# Patient Record
Sex: Male | Born: 2000 | Race: White | Hispanic: No | Marital: Single
Health system: Southern US, Community
[De-identification: ages and names within clinical notes are randomized; demographics above are authoritative.]

## PROBLEM LIST (undated history)

## (undated) DIAGNOSIS — K50011 Crohn's disease of small intestine with rectal bleeding: Secondary | ICD-10-CM

## (undated) DIAGNOSIS — G919 Hydrocephalus, unspecified: Secondary | ICD-10-CM

## (undated) DIAGNOSIS — F909 Attention-deficit hyperactivity disorder, unspecified type: Secondary | ICD-10-CM

## (undated) DIAGNOSIS — G060 Intracranial abscess and granuloma: Secondary | ICD-10-CM

## (undated) HISTORY — PX: BRAIN SURGERY: SHX531

## (undated) HISTORY — DX: Attention-deficit hyperactivity disorder, unspecified type: F90.9

---
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
## 2022-01-23 ENCOUNTER — Other Ambulatory Visit: Payer: Self-pay

## 2022-01-23 ENCOUNTER — Emergency Department
Admission: EM | Admit: 2022-01-23 | Discharge: 2022-01-23 | Disposition: A | Discharge status: Home / Self Care | Payer: Medicaid Other | Attending: Student in an Organized Health Care Education/Training Program | Admitting: Student in an Organized Health Care Education/Training Program

## 2022-01-23 ENCOUNTER — Encounter: Payer: Self-pay | Admitting: Emergency Medicine

## 2022-01-23 DIAGNOSIS — L237 Allergic contact dermatitis due to plants, except food: Secondary | ICD-10-CM | POA: Insufficient documentation

## 2022-01-23 DIAGNOSIS — R21 Rash and other nonspecific skin eruption: Secondary | ICD-10-CM | POA: Diagnosis present

## 2022-01-23 MED ORDER — TRIAMCINOLONE ACETONIDE 0.1 % EX CREA: 1.0000 | TOPICAL_CREAM | Freq: Four times a day (QID) | CUTANEOUS | 1 refills | Status: DC

## 2022-01-23 MED ORDER — TRIAMCINOLONE ACETONIDE 40 MG/ML IJ SUSP
40.0000 mg | Freq: Once | INTRAMUSCULAR | Status: AC
  Administered 2022-01-23: 40 mg via INTRAMUSCULAR
  Filled 2022-01-23: qty 1

## 2022-01-23 NOTE — ED Triage Notes (Signed)
Pt via POV from home. Pt c/o rash that started on his L forearm, it started 1 week ago put note noticed it on his stomach and R forearm. Pt states it is intermittently painful but itches. Denies any bug bites. Denies any cough/congestion or fevers. Denies any new detergent, soaps or lotions. Pt has a redness to the R forearm. Pt tried OTC medication with no relief.

## 2022-01-23 NOTE — ED Provider Notes (Signed)
[facility] Provider Note  Patient Contact: 6:25 PM (approximate)   History   Rash   HPI  Roy Burch is a 21 y.o. male who presents the emergency department complaining of a rash to bilateral arms, right abdominal wall.  Patient reports that it is very pruritic.  He denies any new medications, foods, topicals, lotions, detergents, soaps or shampoos.  Patient states that rash first developed on the left forearm, then went to the left upper arm, then right arm and then abdominal wall.  He is tried over-the-counter medications with no relief.  Symptoms have been going on for the last 6 to 7 days.     Physical Exam   Triage Vital Signs: ED Triage Vitals  Enc Vitals Group     BP 01/23/22 1700 123/83     Pulse Rate 01/23/22 1700 91     Resp 01/23/22 1700 18     Temp 01/23/22 1700 98.4 F (36.9 C)     Temp Source 01/23/22 1700 Oral     SpO2 01/23/22 1700 98 %     Weight 01/23/22 1659 120 lb (54.4 kg)     Height 01/23/22 1659 5\' 10"  (1.778 m)     Head Circumference --      Peak Flow --      Pain Score 01/23/22 1659 0     Pain Loc --      Pain Edu? --      Excl. in GC? --     Most recent vital signs: Vitals:   01/23/22 1700  BP: 123/83  Pulse: 91  Resp: 18  Temp: 98.4 F (36.9 C)  SpO2: 98%     General: Alert and in no acute distress.  Cardiovascular:  Good peripheral perfusion Respiratory: Normal respiratory effort without tachypnea or retractions. Lungs CTAB.  Musculoskeletal: Full range of motion to all extremities.  Neurologic:  No gross focal neurologic deficits are appreciated.  Skin:   Patient has a papular rash to both forearms, left upper arm and right abdominal wall.  Findings are consistent with contact dermatitis and from the presentation likely poison ivy/oak/sumac.  Some excoriations from scratching.  No cellulitic changes. Other:   ED Results / Procedures / Treatments   Labs (all labs ordered are listed, but only  abnormal results are displayed) Labs Reviewed - No data to display   EKG     RADIOLOGY    No results found.  PROCEDURES:  Critical Care performed: No  Procedures   MEDICATIONS ORDERED IN ED: Medications  triamcinolone acetonide (KENALOG-40) injection 40 mg (has no administration in time range)     IMPRESSION / MDM / ASSESSMENT AND PLAN / ED COURSE  I reviewed the triage vital signs and the nursing notes.                              Differential diagnosis includes, but is not limited to, contact dermatitis, allergic reaction, poison ivy, poison sumac, poison oak  Patient's presentation is most consistent with acute illness / injury with system symptoms.   Patient's diagnosis is consistent with poison ivy dermatitis.  Patient presented to the emergency department with rash to both arms, right abdominal wall.  Patient's rash is consistent with poison ivy/oak/sumac dermatitis.  Patient will be treated with Kenalog injection.  Topical triamcinolone will be prescribed for the patient at home for symptom relief..  Follow-up primary care as needed.  Patient  is given ED precautions to return to the ED for any worsening or new symptoms.        FINAL CLINICAL IMPRESSION(S) / ED DIAGNOSES   Final diagnoses:  Poison ivy dermatitis     Rx / DC Orders   ED Discharge Orders          Ordered    triamcinolone cream (KENALOG) 0.1 %  4 times daily        01/23/22 1828             Note:  This document was prepared using Dragon voice recognition software and may include unintentional dictation errors.   Lanette Hampshire 01/23/22 Silva Bandy    Willy Eddy, MD 01/23/22 [phone]

## 2024-06-17 DIAGNOSIS — F411 Generalized anxiety disorder: Secondary | ICD-10-CM | POA: Insufficient documentation

## 2024-07-02 ENCOUNTER — Ambulatory Visit

## 2024-07-24 ENCOUNTER — Ambulatory Visit

## 2024-07-24 ENCOUNTER — Observation Stay
Admission: EM | Admit: 2024-07-24 | Discharge: 2024-07-26 | Disposition: A | Attending: Obstetrics and Gynecology | Admitting: Obstetrics and Gynecology

## 2024-07-24 ENCOUNTER — Other Ambulatory Visit: Payer: Self-pay

## 2024-07-24 ENCOUNTER — Encounter: Payer: Self-pay | Admitting: Emergency Medicine

## 2024-07-24 VITALS — BP 102/70 | HR 102 | Ht 69.0 in | Wt 122.8 lb

## 2024-07-24 DIAGNOSIS — K5090 Crohn's disease, unspecified, without complications: Secondary | ICD-10-CM | POA: Insufficient documentation

## 2024-07-24 DIAGNOSIS — F411 Generalized anxiety disorder: Secondary | ICD-10-CM | POA: Diagnosis not present

## 2024-07-24 DIAGNOSIS — K50911 Crohn's disease, unspecified, with rectal bleeding: Secondary | ICD-10-CM

## 2024-07-24 DIAGNOSIS — K625 Hemorrhage of anus and rectum: Secondary | ICD-10-CM

## 2024-07-24 DIAGNOSIS — K922 Gastrointestinal hemorrhage, unspecified: Secondary | ICD-10-CM | POA: Insufficient documentation

## 2024-07-24 DIAGNOSIS — D649 Anemia, unspecified: Secondary | ICD-10-CM | POA: Diagnosis not present

## 2024-07-24 DIAGNOSIS — K5000 Crohn's disease of small intestine without complications: Secondary | ICD-10-CM | POA: Insufficient documentation

## 2024-07-24 DIAGNOSIS — R42 Dizziness and giddiness: Secondary | ICD-10-CM

## 2024-07-24 HISTORY — DX: Crohn's disease of small intestine with rectal bleeding: K50.011

## 2024-07-24 HISTORY — DX: Hydrocephalus, unspecified: G91.9

## 2024-07-24 HISTORY — DX: Intracranial abscess and granuloma: G06.0

## 2024-07-24 LAB — HIV ANTIBODY (ROUTINE TESTING W REFLEX): HIV Screen 4th Generation wRfx: NONREACTIVE

## 2024-07-24 LAB — CBC WITH DIFFERENTIAL/PLATELET
Abs Immature Granulocytes: 0.03 K/uL (ref 0.00–0.07)
Basophils Absolute: 0 K/uL (ref 0.0–0.1)
Basophils Relative: 0 %
Eosinophils Absolute: 0 K/uL (ref 0.0–0.5)
Eosinophils Relative: 0 %
HCT: 22.3 % — ABNORMAL LOW (ref 39.0–52.0)
Hemoglobin: 6.8 g/dL — ABNORMAL LOW (ref 13.0–17.0)
Immature Granulocytes: 0 %
Lymphocytes Relative: 5 %
Lymphs Abs: 0.5 K/uL — ABNORMAL LOW (ref 0.7–4.0)
MCH: 25.2 pg — ABNORMAL LOW (ref 26.0–34.0)
MCHC: 30.5 g/dL (ref 30.0–36.0)
MCV: 82.6 fL (ref 80.0–100.0)
Monocytes Absolute: 0.7 K/uL (ref 0.1–1.0)
Monocytes Relative: 8 %
Neutro Abs: 8.6 K/uL — ABNORMAL HIGH (ref 1.7–7.7)
Neutrophils Relative %: 87 %
Platelets: 242 K/uL (ref 150–400)
RBC: 2.7 MIL/uL — ABNORMAL LOW (ref 4.22–5.81)
RDW: 14.5 % (ref 11.5–15.5)
WBC: 9.9 K/uL (ref 4.0–10.5)
nRBC: 0 % (ref 0.0–0.2)

## 2024-07-24 LAB — COMPREHENSIVE METABOLIC PANEL WITH GFR
ALT: 12 U/L (ref 0–44)
AST: 17 U/L (ref 15–41)
Albumin: 3.5 g/dL (ref 3.5–5.0)
Alkaline Phosphatase: 50 U/L (ref 38–126)
Anion gap: 9 (ref 5–15)
BUN: 18 mg/dL (ref 6–20)
CO2: 26 mmol/L (ref 22–32)
Calcium: 8.1 mg/dL — ABNORMAL LOW (ref 8.9–10.3)
Chloride: 104 mmol/L (ref 98–111)
Creatinine, Ser: 0.83 mg/dL (ref 0.61–1.24)
GFR, Estimated: >60 mL/min (ref >60)
Glucose, Bld: 79 mg/dL (ref 70–99)
Potassium: 3.8 mmol/L (ref 3.5–5.1)
Sodium: 138 mmol/L (ref 135–145)
Total Bilirubin: <0.2 mg/dL (ref 0.0–1.2)
Total Protein: 5.3 g/dL — ABNORMAL LOW (ref 6.5–8.1)

## 2024-07-24 LAB — IRON AND TIBC
Iron: <10 ug/dL — ABNORMAL LOW (ref 45–182)
TIBC: 279 ug/dL (ref 250–450)

## 2024-07-24 LAB — TROPONIN T, HIGH SENSITIVITY
Troponin T High Sensitivity: <15 ng/L (ref 0–19)
Troponin T High Sensitivity: <15 ng/L (ref 0–19)
Troponin T High Sensitivity: <15 ng/L (ref 0–19)

## 2024-07-24 LAB — HEMOGLOBIN AND HEMATOCRIT, BLOOD
HCT: 26.3 % — ABNORMAL LOW (ref 39.0–52.0)
Hemoglobin: 8.4 g/dL — ABNORMAL LOW (ref 13.0–17.0)

## 2024-07-24 LAB — SEDIMENTATION RATE: Sed Rate: 26 mm/h — ABNORMAL HIGH (ref 0–15)

## 2024-07-24 LAB — FERRITIN: Ferritin: 6 ng/mL — ABNORMAL LOW (ref 24–336)

## 2024-07-24 LAB — C-REACTIVE PROTEIN: CRP: 0.7 mg/dL (ref <1.0)

## 2024-07-24 LAB — APTT: aPTT: 31 s (ref 24–36)

## 2024-07-24 LAB — TSH: TSH: 0.861 u[IU]/mL (ref 0.350–4.500)

## 2024-07-24 LAB — MAGNESIUM: Magnesium: 1.8 mg/dL (ref 1.7–2.4)

## 2024-07-24 LAB — PREPARE RBC (CROSSMATCH)

## 2024-07-24 LAB — PROTIME-INR
INR: 1.1 (ref 0.8–1.2)
Prothrombin Time: 15.2 s (ref 11.4–15.2)

## 2024-07-24 MED ORDER — ACETAMINOPHEN 325 MG PO TABS: 650.0000 mg | ORAL_TABLET | Freq: Four times a day (QID) | ORAL | Status: DC | PRN

## 2024-07-24 MED ORDER — ACETAMINOPHEN 650 MG RE SUPP: 650.0000 mg | Freq: Four times a day (QID) | RECTAL | Status: DC | PRN

## 2024-07-24 MED ORDER — PEG 3350-KCL-NA BICARB-NACL 420 G PO SOLR
4000.0000 mL | Freq: Once | ORAL | Status: AC
  Administered 2024-07-24: 4000 mL via ORAL
  Filled 2024-07-24: qty 4000

## 2024-07-24 MED ORDER — SODIUM CHLORIDE 0.9 % IV BOLUS
1000.0000 mL | Freq: Once | INTRAVENOUS | Status: AC
  Administered 2024-07-24: 1000 mL via INTRAVENOUS

## 2024-07-24 MED ORDER — SODIUM CHLORIDE 0.9% FLUSH
3.0000 mL | Freq: Two times a day (BID) | INTRAVENOUS | Status: DC
  Administered 2024-07-24 – 2024-07-26 (×5): 3 mL via INTRAVENOUS

## 2024-07-24 MED ORDER — SODIUM CHLORIDE 0.9 % IV SOLN: INTRAVENOUS | Status: AC

## 2024-07-24 MED ORDER — SODIUM CHLORIDE 0.9 % IV SOLN: Freq: Once | INTRAVENOUS | Status: AC

## 2024-07-24 NOTE — H&P (Addendum)
 History and Physical    Roy Burch FMW:[id number] DOB: 04-25-2001 DOA: 07/24/2024  DOS: the patient was seen and examined on 07/24/2024  PCP: Everlene Parris LABOR, MD   Patient coming from: Clinic  I have personally briefly reviewed patient's old medical records in [facility] Link and CareEverywhere  HPI:   Roy Burch is a 23 y.o. year old male with medical history of Crohn's disease previously on Humira and MDD presenting to the ED for rectal bleeding of subacute nature.   Pt states he has been having some abdominal discomfort and blood in stools since Sunday, 07/20/2024.  Patient states he has these episodes but they do not last this long.  He has history of Crohn's disease and was on Humira but is not on it anymore.  He is not able to give me a reason why he is currently not taking this medication.  He denies any fevers or chills.  He states he was in the process of establishing with a PCP and they referred him here given he became lightheaded when they are drawing his blood.   On arrival to the ED patient was noted to be HDS stable.  Lab work obtained.  CBC without leukocytosis but anemia at 6.8 with baseline around 10.  MCV is normal at 82.  CMP overall unremarkable but with mild decrease in calcium and protein levels.  Given his anemia and symptoms of lightheadedness, [facility] contacted for admission.  Review of Systems: As mentioned in the history of present illness. All other systems reviewed and are negative.   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 He does not have any smokeless tobacco history on file. He reports that he does not drink alcohol. No history on file for drug use. Tobacco-denies use EtOH-drinks occasional alcohol Illicit drug use- denies use.  IADLs/ADLs- can perform independently at baseline    Physical Exam: Vitals:   07/24/24 1115 07/24/24 1203 07/24/24 1341 07/24/24 1405  BP: (!) 112/54 (!) 117/56 (!) 100/55 (!) 97/58  Pulse: (!) 109 (!) 104 (!) 101 82  Resp: 18 14 16 16   Temp:   98 F (36.7 C) 98.5 F (36.9 C)  TempSrc:   Oral Oral  SpO2: 100% 100% 100% 100%  Weight:      Height:       Gen: NAD HENT: Conjunctival pallor CV: Sinus tachycardia, good radial pulse  Resp: CTAB Abd: No TTP, normal bowel sounds MSK: No asymmetry Skin: Pale skin Neuro: Alert and oriented x 4 Psych: Pleasant mood   Labs on Admission: I have personally reviewed following labs and imaging studies  CBC: Recent Labs  Lab 07/24/24 1148  WBC 9.9  NEUTROABS 8.6*  HGB 6.8*  HCT 22.3*  MCV 82.6  PLT 242  Basic Metabolic Panel: Recent Labs  Lab 07/24/24 1148  NA 138  K 3.8  CL 104  CO2 26  GLUCOSE 79  BUN 18  CREATININE 0.83  CALCIUM 8.1*   GFR: Estimated Creatinine Clearance: 108.9 mL/min (by C-G formula based on SCr of 0.83 mg/dL). Liver Function Tests: Recent Labs  Lab 07/24/24 1148  AST 17  ALT 12  ALKPHOS 50  BILITOT <0.2  PROT 5.3*  ALBUMIN 3.5   No results for input(s): LIPASE, AMYLASE in the last 168 hours. No results for input(s): AMMONIA in the last 168 hours. Coagulation Profile: Recent Labs  Lab 07/24/24 1148  INR 1.1   Cardiac Enzymes: No results for input(s): CKTOTAL, CKMB, CKMBINDEX, TROPONINI, TROPONINIHS in the last 168 hours. BNP (last 3 results) No results for input(s): BNP in the last 8760  hours. HbA1C: No results for input(s): HGBA1C in the last 72 hours. CBG: No results for input(s): GLUCAP in the last 168 hours. Lipid Profile: No results for input(s): CHOL, HDL, LDLCALC, TRIG, CHOLHDL, LDLDIRECT in the last 72 hours. Thyroid  Function Tests: No results for input(s): TSH, T4TOTAL, FREET4, T3FREE, THYROIDAB in the last 72 hours. Anemia Panel: No results for input(s): VITAMINB12, FOLATE, FERRITIN, TIBC, IRON, RETICCTPCT in the last 72 hours. Urine analysis:    Component Value Date/Time   COLORURINE YELLOW (A) 12/25/2017 1646   APPEARANCEUR CLOUDY (A) 12/25/2017 1646   APPEARANCEUR Clear 12/20/2012 0048   LABSPEC 1.013 12/25/2017 1646   LABSPEC 1.031 12/20/2012 0048   PHURINE 7.0 12/25/2017 1646   GLUCOSEU NEGATIVE 12/25/2017 1646   GLUCOSEU Negative 12/20/2012 0048   HGBUR NEGATIVE 12/25/2017 1646   BILIRUBINUR NEGATIVE 12/25/2017 1646   BILIRUBINUR Negative 12/20/2012 0048   KETONESUR 5 (A) 12/25/2017 1646   PROTEINUR NEGATIVE 12/25/2017 1646   NITRITE NEGATIVE 12/25/2017 1646   LEUKOCYTESUR NEGATIVE 12/25/2017 1646   LEUKOCYTESUR Negative 12/20/2012 0048    Radiological Exams on Admission: I have personally reviewed images No results found.  EKG: My personal interpretation of EKG shows: Sinus tachycardia without any ischemic changes.  I did show J-point elevation.  Assessment/Plan Principal Problem:   Symptomatic anemia Active Problems:   Exacerbation of Crohn's disease (HCC)   Patient with lightheadedness and blurry vision along with complaint of rectal bleeding found to have hemoglobin of 6.8.  This is likely in the setting of Crohn's flare.  Given his reporting 5 bloody bowel movements we will treat him with steroids.  Will also obtain GI consult to guide further therapy.  It appears he saw [facility] in 2021 but failed to establish with them.  Will get iron studies here.  Will defer further testing to GI team.  If  patient does not improve with current treatment, may need abdominal imaging but will hold off currently.  Stool studies were considered but would likely show inflammation in setting of bloody bowel movements.  We do have baseline ESR and CRP so we will obtain those.  Interval update: given the plan for colonoscopy tomorrow by GI, will hold off on IV steroids for now.   Crohn's disease: Patient was diagnosed in 2020 after colonoscopy showed acute ileitis and small perianal abscess and was discharged on budesonide and then transition to Humira.  He will need to establish with GI.  Appreciate the recommendations while patient is admitted.  Lightheadedness: Patient with episode of lightheadedness which occurred while patient was having blood drawn and patient reports blurry vision and feel like his extremities were getting cold along with  a sensation that he was going to pass out.  His symptoms appear consistent with vasovagal episode in the setting of decreased hemoglobin.  Will obtain orthostatic vitals.  There was some concern from EDP regarding patient's EKG showing ST elevations, but on my interpretation it appears more consistent with J-point elevation.  Troponin are ordered by EDP and we will follow those.  Per EDP he discussed with cardiology and they recommended repeating EKG once patient's hemoglobin improved and if it showed concerning findings then we will consult them.  VTE prophylaxis:  SCDs  Diet: N.p.o. Code Status:  Full Code Telemetry:  Admission status: Observation, Telemetry bed Patient is from: Home Anticipated d/c is to: Home Anticipated d/c is in: 1-2 days   Family Communication: Updated at bedside  Consults called: GI consult.  EDP discussed with Dr. Jinny   Severity of Illness: The appropriate patient status for this patient is OBSERVATION. Observation status is judged to be reasonable and necessary in order to provide the required intensity of service to ensure the  patient's safety. The patient's presenting symptoms, physical exam findings, and initial radiographic and laboratory data in the context of their medical condition is felt to place them at decreased risk for further clinical deterioration. Furthermore, it is anticipated that the patient will be medically stable for discharge from the hospital within 2 midnights of admission.    Morene Bathe, MD Roy Burch. [facility]

## 2024-07-24 NOTE — ED Triage Notes (Signed)
 Pt via POV from home. Pt c/o rectal bleeding since Sunday. Denies hx of anemia. Pt states he went to his PCP this morning and was getting blood work done and when he was getting her blood done he had a near syncopal episode. Denies any pain. States that his bloodwork is being resulted currently. Pt is pale on arrival but VSS. Pt is A&Ox4 and NAD, ambulatory to triage with steady gait.

## 2024-07-24 NOTE — Progress Notes (Addendum)
 New Patient Visit   Physician: Horace Lukas A Nekisha Mcdiarmid, MD  Patient: Roy Burch   DOB: [birth year]   23 y.o. Male  MRN: [id number] Visit Date: 07/24/2024   Chief Complaint  Patient presents with   Establish Care   Subjective  Roy Burch is a 23 y.o. male who presents today as a new patient to establish care.   HPI  Discussed the use of AI scribe software for clinical note transcription with the patient, who gave verbal consent to proceed.  History of Present Illness   Roy Burch is a 23 year old male with Crohn's disease who presents with a flare-up characterized by blood in stool and abdominal pain.  Gastrointestinal symptoms - Diagnosed with Crohn's disease in May 2020 - Flare-up since Sunday night with blood in stool - Bleeding sometimes significant, covering the bottom of the toilet or surrounding the stool - Mild abdominal pain present - Not currently on medication for Crohn's disease as symptoms had been previously without issues  Cardiorespiratory symptoms - suspect anemia - Shortness of breath and lightheadedness, particularly with increased heart rate during physical activity - No chest pain - Heart rate increases quickly with exertion  Psychological symptoms - Situational anxiety when interacting with new people or in new events - Initially introverted but becomes comfortable once familiar with people - History of mild depression diagnosed by a therapist - No current or prior medication for anxiety or depression - Works two physically demanding jobs, totaling 50-55 hours per week, contributing to stress and anxiety  Sleep disturbance - Sleep duration 4-6 hours per night - Acknowledges sleep is insufficient  Lifestyle factors - Occasional alcohol consumption - History of smoking in high school, not regular since then - Diet includes fruits and vegetables - Engages in physical activity through work         ASSESSMENT & PLAN  Encounter  Diagnoses  Name Primary?   Crohn's disease with rectal bleeding, unspecified gastrointestinal tract location (HCC) Yes   Generalized anxiety disorder     Orders Placed This Encounter  Procedures   CALPROTECTIN   CBC with Differential/Platelet   Comprehensive metabolic panel with GFR   C-reactive protein   Iron, TIBC and Ferritin Panel   C-reactive protein   Hemoglobin A1c    Assessment and Plan    Crohn's disease with rectal bleeding - Ordered labs  - CRP, fecal calprotectin, CBC, iron, CMP - Referred to gastroenterology. - Advised to avoid NSAIDs; use Tylenol  for pain. - Instructed to report increased pain, fever, chills, or persistent bleeding.  Iron deficiency anemia (suspected) Suspected due to rectal bleeding and symptoms of lightheadedness and shortness of breath. - Ordered labs for hemoglobin and iron levels. - Monitor and report worsening symptoms.  Situational anxiety Experiences anxiety in new social situations. Discussed medication versus counseling benefits. - Consider counseling. - Discuss situational anxiety medication. - Encouraged self-care and adequate sleep.     He would like to discuss autism as a possibility.  We will discuss at f/u visit in 1 week    ADDENDUM  Patient pre-sycope in lab room.  More blood in stool with BM, moderate volume.  SaO2 98, HR 89 on repeat.  Pale but stable.  Advised to present to ED due to recurrent hematochezia, likely anemia and persistent bleeding. Patient agreed and will have family member transport him.  Needs further eval today for bleeding source.  [facility] called, report to charge nurse given    Objective  BP 102/70   Pulse (!) 102   Ht 5' 9 (1.753 m)   Wt 122 lb 12.8 oz (55.7 kg)   SpO2 (!) 74%   BMI 18.13 kg/m      Review of Systems  Constitutional:  Negative for chills, fever and weight loss.  Eyes:  Negative for blurred vision. h Respiratory:  Negative for cough and shortness of breath.    Cardiovascular:  Negative for chest pain and palpitations.  Skin:  Negative for rash.  Psychiatric/Behavioral:  Negative for depression. The patient is not nervous/anxious.      Physical Exam Physical Exam Vitals reviewed.  Constitutional:      Appearance: Normal appearance. Well-developed with normal weight.  HENT:     Head: Normocephalic and atraumatic.  Normal mucous membranes, no oral lesions Eyes:     Pupils: Pupils are equal, round, and reactive to light.  Neck:     Thyroid : No thyroid  mass or thyromegaly.  Cardiovascular:     Rate and Rhythm: Normal rate and regular rhythm. Normal heart sounds. Normal peripheral pulses Pulmonary:     Normal breath sounds with normal effort Abdominal:   Abdomen is soft, without tenderness or noted hepatosplenomegaly Musculoskeletal:        General: No swelling or edema  Lymphadenopathy:     Cervical: No cervical adenopathy.  Skin:    General: Skin is warm and dry without noticeable rash.  Pallor noted, pale mucous membranes Neurological:     General: No focal deficit present.  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   No family history on file. Social History   Socioeconomic History   Marital status: Single    Spouse name: Not on file   Number of children: Not on file   Years of education: Not on file   Highest education level: Not on file  Occupational History   Not on file  Tobacco Use   Smoking status: Passive Smoke Exposure - Never Smoker   Smokeless tobacco: Not on file  Substance and Sexual Activity   Alcohol use: No   Drug use: Not on file   Sexual activity: Not on file  Other Topics Concern   Not on file  Social History Narrative   Not on  file   Social Drivers of Health   Financial Resource Strain: Not on file  Food Insecurity: Not on file  Transportation Needs: Not on file  Physical Activity: Not on file  Stress: Not on file  Social Connections: Not on file   Outpatient Medications Prior to Visit  Medication Sig   [DISCONTINUED] diphenhydramine-acetaminophen  (TYLENOL  PM) 25-500 MG TABS tablet Take 2 tablets by mouth at bedtime as needed.   [DISCONTINUED] triamcinolone  cream (KENALOG ) 0.1 % Apply 1 application. topically 4 (four) times daily.   No facility-administered medications prior to visit.   Allergies  Allergen Reactions   Poison Ivy Treatments Itching     There is no immunization history on file for this patient.  Health Maintenance  Topic Date Due   HPV VACCINES (1 - Male 3-dose series) Never done   HIV Screening  Never done   Meningococcal B Vaccine (1 of 2 - Standard) Never  done   Hepatitis C Screening  Never done   DTaP/Tdap/Td (1 - Tdap) Never done   Hepatitis B Vaccines 19-59 Average Risk (1 of 3 - 19+ 3-dose series) Never done   Influenza Vaccine  Never done   COVID-19 Vaccine (1 - 2025-26 season) Never done   Pneumococcal Vaccine  Aged Out    Patient Care Team: Pcp, No as PCP - General  Depression Screen     No data to display           Parris DELENA Juneau, MD  [facility] [phone] (phone) [phone] (fax)  [facility]

## 2024-07-24 NOTE — Consult Note (Signed)
 Rogelia Copping, MD [facility]  [address], [address] [place], [place] [address] Phone: [phone] Fax : [phone]  Consultation  Referring Provider:     Dr. Fernand Primary Care Physician:  Everlene Parris LABOR, MD Primary Gastroenterologist:  Dr. Aundria         Reason for Consultation:     Crohn's flare  Date of Admission:  07/24/2024 Date of Consultation:  07/24/2024         HPI:   Roy Burch is a 23 y.o. male who has a history of being diagnosed with Crohn disease after being seen by [facility]'s back in May 2020 with acute onset of hematochezia resulting in anemia.  The patient was transfused and underwent a Meckel scan which was negative and subsequently underwent a exploratory laparotomy at [facility] with no evidence of a Meckel's diverticulum but inflammation was seen involving 20 cm of the terminal ileum filled with blood.  He underwent an EGD and colonoscopy at that time that showed acute inflammation that may be consistent with Crohn's disease.  The patient was started on budesonide  and ended up having a small perianal abscess.  The patient was then started on Humira back in 2020 but states he only took it for couple months and cannot remember if it helped him or not.  Patient received Flagyl for his perianal abscess.  He then followed up with Dr. Aundria at [facility] back in 2021 and subsequent to that did not follow-up again.  He reports that he started having bloody stools on "Sunday with multiple episodes.  He was establishing care with a local primary care doctor and had labs drawn that showed him to be anemic and he was told to go to the emergency department.  The patient's blood work showed the patient to have very low iron at less than 10 with a ferritin of 6 and his CBC showed:  Component     Latest Ref Rng 07/24/2024  Hemoglobin     13.0 - 17.0 g/dL 6.8 (L)   HCT     39" .0 - 52.0 % 22.3 (L)    The patient also reported that he was having some  lightheadedness with blurred vision in addition to his rectal bleeding.  Further testing was deferred to the GI team.  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   Mastoiditis of left side 01/07/2018    Past Surgical History:  Procedure Laterality Date   BRAIN SURGERY      Prior to Admission medications   Not on File    Family History  Problem Relation Age of Onset   Mental illness Father    Thyroid  disease Father    Bipolar disorder Father    Heart disease Father    Cancer Father    Depression Father      Social History   Tobacco Use   Smoking status: Passive Smoke Exposure - Never Smoker  Substance Use Topics   Alcohol use: No    Allergies as of 07/24/2024 - Review Complete 07/24/2024  Allergen Reaction Noted   Poison ivy treatments Itching 07/24/2024    Review of Systems:    All systems reviewed and negative except where noted in HPI.   Physical Exam:  Vital signs in last 24 hours: Temp:  [97.8 F (36.6 C)-98.5 F (36.9 C)] 97.9 F (36.6 C) (12/04 1452) Pulse Rate:  [80-112] 80 (12/04 1452) Resp:  [  14-18] 16 (12/04 1452) BP: (91-117)/(46-71) 91/46 (12/04 1452) SpO2:  [74 %-100 %] 100 % (12/04 1452) Weight:  [55.6 kg-55.7 kg] 55.6 kg (12/04 1051)   General:   Pleasant, cooperative in NAD Head:  Normocephalic and atraumatic. Eyes:   No icterus.   Conjunctiva pink. PERRLA. Ears:  Normal auditory acuity. Neck:  Supple; no masses or thyroidomegaly Lungs: Respirations even and unlabored. Lungs clear to auscultation bilaterally.   No wheezes, crackles, or rhonchi.  Heart:  Regular rate and rhythm;  Without murmur, clicks, rubs or gallops Abdomen:  Soft, nondistended, nontender. Normal bowel sounds. No appreciable masses or hepatomegaly.  No rebound or guarding.  Rectal:  Not performed. Msk:  Symmetrical without gross deformities.   Extremities:  Without edema, cyanosis or clubbing. Neurologic:  Alert and  oriented x3;  grossly normal neurologically. Skin:  Intact without significant lesions or rashes. Cervical Nodes:  No significant cervical adenopathy. Psych:  Alert and cooperative. Normal affect.  LAB RESULTS: Recent Labs    07/24/24 1148  WBC 9.9  HGB 6.8*  HCT 22.3*  PLT 242   BMET Recent Labs    07/24/24 1148  NA 138  K 3.8  CL 104  CO2 26  GLUCOSE 79  BUN 18  CREATININE 0.83  CALCIUM 8.1*   LFT Recent Labs    07/24/24 1148  PROT 5.3*  ALBUMIN 3.5  AST 17  ALT 12  ALKPHOS 50  BILITOT <0.2   PT/INR Recent Labs    07/24/24 1148  LABPROT 15.2  INR 1.1    STUDIES: No results found.    Impression / Plan:   Assessment: Principal Problem:   Symptomatic anemia Active Problems:   Exacerbation of Crohn's disease (HCC)   Lightheadedness   Roy Burch is a 23 y.o. y/o male with a history of active colitis in the terminal ileum with imaging showing terminal ileitis and being diagnosed in the past with Crohn's disease.  The patient was on Humira but has not followed up with anybody since 2021.  The patient does follow-up with Dr. Aundria but has not been on any maintenance therapy.  He now comes in with rectal bleeding.  He also was found to have significant iron deficiency anemia.  Plan:  This patient likely has a flare of chronic disease but still needs stool sample sent off.  The patient has been ordered a GI panel and also C. difficile.  The patient will have a colonoscopy set up for tomorrow to see the extent of disease and obtain biopsies for staging of his Crohn's disease and possible HSV and CMV as well.  The patient has been explained the plan and agrees with taking the prep today for the colonoscopy for tomorrow.  Thank you for involving me in the care of this patient.      LOS: 0 days   Rogelia Copping, MD, MD. NOLIA 07/24/2024, 2:55 PM,  Pager [phone] 7am-5pm  Check AMION for 5pm -7am coverage and on weekends   Note: This dictation was  prepared with Dragon dictation along with smaller phrase technology. Any transcriptional errors that result from this process are unintentional.

## 2024-07-24 NOTE — ED Provider Notes (Signed)
 [facility] Provider Note    Event Date/Time   First MD Initiated Contact with Patient 07/24/24 1102     (approximate)   History   Rectal Bleeding   HPI  Roy Burch is a 23 y.o. male with history of Crohn's disease who comes in with concerns for rectal bleeding.  I reviewed the note from today where patient was seen by primary care doctor.  Patient is history of Crohn's disease diagnosed in May 2020 patient reports having a flareup since Sunday night with blood in the stool sometimes the blood is significant coming the bottom of the toilet or surrounding the stool.  He is not currently on any medications for Crohn's disease.  Does report feeling a little lightheaded.  When patient was having his blood work done he had a near syncopal episode.  To me patient denies any abdominal pain.  To me he mostly reports rectal bleeding.  He reports that this has been intermittent since Sunday.  He reports that this happened previously before he was diagnosed with Crohn's disease and that he did lose enough blood that he required a blood transfusion. He reports that whenever try to get the blood work that he did feel little bit of dizziness but did not fully pass out did not hit his head.  Physical Exam   Triage Vital Signs: ED Triage Vitals  Encounter Vitals Group     BP 07/24/24 1052 117/71     Girls Systolic BP Percentile --      Girls Diastolic BP Percentile --      Boys Systolic BP Percentile --      Boys Diastolic BP Percentile --      Pulse Rate 07/24/24 1052 (!) 112     Resp 07/24/24 1052 18     Temp 07/24/24 1052 97.8 F (36.6 C)     Temp Source 07/24/24 1052 Oral     SpO2 07/24/24 1052 100 %     Weight 07/24/24 1051 122 lb 9.6 oz (55.6 kg)     Height 07/24/24 1051 5' 9 (1.753 m)     Head Circumference --      Peak Flow --      Pain Score 07/24/24 1051 0     Pain Loc --      Pain Education --      Exclude from Growth Chart --     Most  recent vital signs: Vitals:   07/24/24 1052 07/24/24 1114  BP: 117/71   Pulse: (!) 112   Resp: 18   Temp: 97.8 F (36.6 C)   SpO2: 100% 100%     General: Awake, no distress.  CV:  Good peripheral perfusion.  Tachycardic Resp:  Normal effort.  Abd:  No distention.  Soft and nontender Other:  No blood on rectal    ED Results / Procedures / Treatments   Labs (all labs ordered are listed, but only abnormal results are displayed) Labs Reviewed  CBC WITH DIFFERENTIAL/PLATELET - Abnormal; Notable for the following components:      Result Value   RBC 2.70 (*)    Hemoglobin 6.8 (*)    HCT 22.3 (*)    MCH 25.2 (*)    Neutro Abs 8.6 (*)    Lymphs Abs 0.5 (*)    All other components within normal limits  COMPREHENSIVE METABOLIC PANEL WITH GFR  PROTIME-INR  APTT  TYPE AND SCREEN     EKG  My interpretation of EKG:  Normal sinus rhythm 97, with ST elevation in V3 and V2 without any reciprocal changes  RADIOLOGY I have reviewed the xray personally and interpreted    PROCEDURES:  Critical Care performed: Yes, see critical care procedure note(s)  .1-3 Lead EKG Interpretation  Performed by: Ernest Ronal BRAVO, MD Authorized by: Ernest Ronal BRAVO, MD     Interpretation: normal     ECG rate:  80   ECG rate assessment: normal     Rhythm: sinus rhythm     Ectopy: none     Conduction: normal   .Critical Care  Performed by: Ernest Ronal BRAVO, MD Authorized by: Ernest Ronal BRAVO, MD   Critical care provider statement:    Critical care time (minutes):  30   Critical care was necessary to treat or prevent imminent or life-threatening deterioration of the following conditions: symptomatic anemia.   Critical care was time spent personally by me on the following activities:  Development of treatment plan with patient or surrogate, discussions with consultants, evaluation of patient's response to treatment, examination of patient, ordering and review of laboratory studies, ordering and review  of radiographic studies, ordering and performing treatments and interventions, pulse oximetry, re-evaluation of patient's condition and review of old charts    MEDICATIONS ORDERED IN ED: Medications  sodium chloride  flush (NS) 0.9 % injection 3 mL (3 mLs Intravenous Given 07/24/24 1359)  acetaminophen  (TYLENOL ) tablet 650 mg (has no administration in time range)    Or  acetaminophen  (TYLENOL ) suppository 650 mg (has no administration in time range)  sodium chloride  0.9 % bolus 1,000 mL (0 mLs Intravenous Stopped 07/24/24 1227)  0.9 %  sodium chloride  infusion (0 mLs Intravenous Stopped 07/24/24 1400)     IMPRESSION / MDM / ASSESSMENT AND PLAN / ED COURSE  I reviewed the triage vital signs and the nursing notes.   Patient's presentation is most consistent with acute presentation with potential threat to life or bodily function.   Patient comes in with multiple episodes of rectal bleeding no current rectal bleeding noted.  No pain in abdomen.  Patient's workup was done to evaluate for anemia related to rectal bleeding do not feel like CT angio is necessary at this time given no active bleeding.  Hemoglobin is low at 6.8   His EKG does look a little abnormal with some ST elevation but I do suspect this is more likely demand regards to anemia.  He has got no active chest pain.  I will add on troponin to evaluate for type II nstemi in relation to anemia.  At this time given known rectal bleeding heparin would make things much worse as well as aspirin I'm going to hold these.  12:20 PM repeat evaluation abdomen soft and nontender.  Discussed with patient abnormal hemoglobin and need for blood.  Patient was consented for blood.  We discussed admission to the hospital for GI consultation and treatment of anemia.  He expressed understanding and felt comfortable with this plan.  12:37 PM his EKG could also be potentially seen with having like Brugada.  I will have him repeat the EKG. D/w Dr Gollan  agree with medically management and  Second EKG looks less brugada and more likely early repol- recommends getting trop and monitoring.  IF elevated then will need to discuss with stemi doc. And reocmmend another EKG after pt gets fluids.   Trop negative   The patient is on the cardiac monitor to evaluate for evidence of arrhythmia and/or significant heart rate  changes.      FINAL CLINICAL IMPRESSION(S) / ED DIAGNOSES   Final diagnoses:  Symptomatic anemia  Gastrointestinal hemorrhage, unspecified gastrointestinal hemorrhage type     Rx / DC Orders   ED Discharge Orders     None        Note:  This document was prepared using Dragon voice recognition software and may include unintentional dictation errors.   Ernest Ronal BRAVO, MD 07/24/24 [phone]

## 2024-07-24 NOTE — ED Notes (Addendum)
 Pt reports that he has noticed dark red blood in his stool since Sunday night and states that on Monday he noticed more blood then on Sunday. Pt states that he went to [facility] this morning to be checked out and try and establish a PCP. Pt reports that during blood work, he became dizzy and lightheaded and saw colors. Pt reports that since Sunday his heart rate beats really fast with any movement or activity. Pt reports a hx of Crohn's. Pt appears pale. Pt is A&Ox4. Pt NAD at this time.

## 2024-07-24 NOTE — ED Notes (Signed)
 Called CCMD to add pt to monitoring.

## 2024-07-24 NOTE — Plan of Care (Signed)

## 2024-07-25 ENCOUNTER — Observation Stay: Admitting: Anesthesiology

## 2024-07-25 ENCOUNTER — Encounter: Admission: EM | Disposition: A | Payer: Self-pay | Source: Home / Self Care | Attending: Emergency Medicine

## 2024-07-25 ENCOUNTER — Encounter: Payer: Self-pay | Admitting: Internal Medicine

## 2024-07-25 DIAGNOSIS — D649 Anemia, unspecified: Secondary | ICD-10-CM | POA: Diagnosis not present

## 2024-07-25 HISTORY — PX: POLYPECTOMY: SHX149

## 2024-07-25 HISTORY — PX: COLONOSCOPY: SHX5424

## 2024-07-25 LAB — GASTROINTESTINAL PANEL BY PCR, STOOL (REPLACES STOOL CULTURE)

## 2024-07-25 LAB — CBC
HCT: 24.6 % — ABNORMAL LOW (ref 39.0–52.0)
HCT: 19.4 % — ABNORMAL LOW (ref 39.0–52.0)
Hemoglobin: 7.6 g/dL — ABNORMAL LOW (ref 13.0–17.0)
Hemoglobin: 6.2 g/dL — ABNORMAL LOW (ref 13.0–17.0)
MCH: 26 pg (ref 26.0–34.0)
MCH: 25.8 pg — ABNORMAL LOW (ref 26.0–34.0)
MCHC: 30.9 g/dL (ref 30.0–36.0)
MCHC: 32 g/dL (ref 30.0–36.0)
MCV: 84.2 fL (ref 80.0–100.0)
MCV: 80.8 fL (ref 80.0–100.0)
Platelets: 165 K/uL (ref 150–400)
Platelets: 181 K/uL (ref 150–400)
RBC: 2.92 MIL/uL — ABNORMAL LOW (ref 4.22–5.81)
RBC: 2.4 MIL/uL — ABNORMAL LOW (ref 4.22–5.81)
RDW: 15.1 % (ref 11.5–15.5)
RDW: 14.8 % (ref 11.5–15.5)
WBC: 4.2 K/uL (ref 4.0–10.5)
WBC: 3.6 K/uL — ABNORMAL LOW (ref 4.0–10.5)
nRBC: 0 % (ref 0.0–0.2)
nRBC: 0 % (ref 0.0–0.2)

## 2024-07-25 LAB — PREPARE RBC (CROSSMATCH)

## 2024-07-25 LAB — HEMOGLOBIN A1C
Hgb A1c MFr Bld: 5.1 % (ref <5.7)
Mean Plasma Glucose: 100 mg/dL
eAG (mmol/L): 5.5 mmol/L

## 2024-07-25 LAB — COMPREHENSIVE METABOLIC PANEL WITH GFR
AG Ratio: 1.9 (calc) (ref 1.0–2.5)
ALT: 10 U/L (ref 9–46)
AST: 16 U/L (ref 10–40)
Albumin: 3.8 g/dL (ref 3.6–5.1)
Alkaline phosphatase (APISO): 52 U/L (ref 36–130)
BUN: 19 mg/dL (ref 7–25)
CO2: 24 mmol/L (ref 20–32)
Calcium: 8.4 mg/dL — ABNORMAL LOW (ref 8.6–10.3)
Chloride: 106 mmol/L (ref 98–110)
Creat: 0.75 mg/dL (ref 0.60–1.24)
Globulin: 2 g/dL (ref 1.9–3.7)
Glucose, Bld: 88 mg/dL (ref 65–99)
Potassium: 4.4 mmol/L (ref 3.5–5.3)
Sodium: 139 mmol/L (ref 135–146)
Total Bilirubin: 0.3 mg/dL (ref 0.2–1.2)
Total Protein: 5.8 g/dL — ABNORMAL LOW (ref 6.1–8.1)
eGFR: 130 mL/min/1.73m2 (ref >=60)

## 2024-07-25 LAB — C-REACTIVE PROTEIN: CRP: 4.7 mg/L (ref <8.0)

## 2024-07-25 LAB — C DIFFICILE QUICK SCREEN W PCR REFLEX
C Diff antigen: NEGATIVE
C Diff interpretation: NOT DETECTED
C Diff toxin: NEGATIVE

## 2024-07-25 LAB — BASIC METABOLIC PANEL WITH GFR
Anion gap: 9 (ref 5–15)
BUN: 11 mg/dL (ref 6–20)
CO2: 24 mmol/L (ref 22–32)
Calcium: 8.3 mg/dL — ABNORMAL LOW (ref 8.9–10.3)
Chloride: 105 mmol/L (ref 98–111)
Creatinine, Ser: 0.8 mg/dL (ref 0.61–1.24)
GFR, Estimated: >60 mL/min (ref >60)
Glucose, Bld: 77 mg/dL (ref 70–99)
Potassium: 4.7 mmol/L (ref 3.5–5.1)
Sodium: 138 mmol/L (ref 135–145)

## 2024-07-25 LAB — IRON,TIBC AND FERRITIN PANEL
%SAT: 5 % — ABNORMAL LOW (ref 20–48)
Ferritin: 6 ng/mL — ABNORMAL LOW (ref 38–380)
Iron: 15 ug/dL — ABNORMAL LOW (ref 50–195)
TIBC: 315 ug/dL (ref 250–425)

## 2024-07-25 LAB — CBC WITH DIFFERENTIAL/PLATELET

## 2024-07-25 LAB — HEMOGLOBIN AND HEMATOCRIT, BLOOD
HCT: 24.3 % — ABNORMAL LOW (ref 39.0–52.0)
Hemoglobin: 8.1 g/dL — ABNORMAL LOW (ref 13.0–17.0)

## 2024-07-25 SURGERY — COLONOSCOPY
Anesthesia: General

## 2024-07-25 MED ORDER — PROPOFOL 10 MG/ML IV BOLUS
INTRAVENOUS | Status: AC
  Filled 2024-07-25: qty 20

## 2024-07-25 MED ORDER — BUDESONIDE 3 MG PO CPEP
9.0000 mg | ORAL_CAPSULE | Freq: Every day | ORAL | Status: DC
  Administered 2024-07-25: 9 mg via ORAL
  Filled 2024-07-25 (×2): qty 3

## 2024-07-25 MED ORDER — BUDESONIDE 3 MG PO CPEP: 9.0000 mg | ORAL_CAPSULE | Freq: Every day | ORAL | Status: DC

## 2024-07-25 MED ORDER — PHENYLEPHRINE HCL (PRESSORS) 10 MG/ML IV SOLN
INTRAVENOUS | Status: DC | PRN
  Administered 2024-07-25: 80 ug via INTRAVENOUS
  Administered 2024-07-25: 40 ug via INTRAVENOUS

## 2024-07-25 MED ORDER — PROPOFOL 1000 MG/100ML IV EMUL
INTRAVENOUS | Status: AC
  Filled 2024-07-25: qty 100

## 2024-07-25 MED ORDER — DEXMEDETOMIDINE HCL IN NACL 80 MCG/20ML IV SOLN
INTRAVENOUS | Status: DC | PRN
  Administered 2024-07-25 (×2): 8 ug via INTRAVENOUS

## 2024-07-25 MED ORDER — PROPOFOL 500 MG/50ML IV EMUL
INTRAVENOUS | Status: DC | PRN
  Administered 2024-07-25: 140 ug/kg/min via INTRAVENOUS
  Administered 2024-07-25: 100 mg via INTRAVENOUS
  Administered 2024-07-25 (×2): 50 mg via INTRAVENOUS

## 2024-07-25 MED ORDER — SODIUM CHLORIDE 0.9% IV SOLUTION: Freq: Once | INTRAVENOUS | Status: AC

## 2024-07-25 NOTE — Plan of Care (Signed)

## 2024-07-25 NOTE — Plan of Care (Signed)
   Problem: Education: Goal: Knowledge of General Education information will improve Description Including pain rating scale, medication(s)/side effects and non-pharmacologic comfort measures Outcome: Progressing

## 2024-07-25 NOTE — Progress Notes (Signed)
 PROGRESS NOTE    Roy Burch  FMW:[id number] DOB: 05/01/2001 DOA: 07/24/2024 PCP: Everlene Parris LABOR, MD  Outpatient Specialists: none    Brief Narrative:   Hx crohn's presenting with 5 days painless hematochezia   Assessment & Plan:   Principal Problem:   Symptomatic anemia Active Problems:   Exacerbation of Crohn's disease (HCC)   Lightheadedness   # Crohn's with exacerbation First diagnosed 2020 at [facility], briefly on budesonide  and then humira, off all meds and no GI f/u since 2021, has been asymptomatic, but now 5 days painless hematochezia - colonoscopy scheduled for today, gi consulted, appreciate recs, will defer therapy for Crohn's to them  # Acute blood loss anemia 2/2 above hematochezia. No recent hgb for comparison, was 6.8 on arrival, transfused one unit, today 7.6. no significant ongoing hematochezia. Symptomatically improved - trend   DVT prophylaxis: SCDs Code Status: full Family Communication: none at bedside  Level of care: Telemetry Status is: Observation    Consultants:  GI  Procedures: pending  Antimicrobials:  none    Subjective: Reports feeling well. Lightheadedness resolved. No abdominal pain.   Objective: Vitals:   07/24/24 1949 07/25/24 0408 07/25/24 0500 07/25/24 0842  BP: 106/67 110/69  115/70  Pulse: 87 88  88  Resp: 16 16  16   Temp: 98 F (36.7 C) 97.6 F (36.4 C)  (!) 97.5 F (36.4 C)  TempSrc:  Oral    SpO2: 100% 100%  100%  Weight:   58.5 kg   Height:        Intake/Output Summary (Last 24 hours) at 07/25/2024 0924 Last data filed at 07/25/2024 9371 Gross per 24 hour  Intake 3218.26 ml  Output 750 ml  Net 2468.26 ml   Filed Weights   07/24/24 1051 07/25/24 0500  Weight: 55.6 kg 58.5 kg    Examination:  General exam: Appears calm and comfortable  Respiratory system: Clear to auscultation. Respiratory effort normal. Cardiovascular system: S1 & S2 heard, RRR. No JVD, murmurs, rubs, gallops or clicks.  No pedal edema. Gastrointestinal system: Abdomen is nondistended, soft and nontender. No organomegaly or masses felt.   Central nervous system: Alert and oriented. No focal neurological deficits. Extremities: Symmetric 5 x 5 power. Skin: pale Psychiatry: Judgement and insight appear normal. Mood & affect appropriate.     Data Reviewed: I have personally reviewed following labs and imaging studies  CBC: Recent Labs  Lab 07/24/24 0857 07/24/24 1148 07/24/24 1731 07/25/24 0512  WBC CANCELED 9.9  --  4.2  NEUTROABS  --  8.6*  --   --   HGB  --  6.8* 8.4* 7.6*  HCT  --  22.3* 26.3* 24.6*  MCV  --  82.6  --  84.2  PLT  --  242  --  165   Basic Metabolic Panel: Recent Labs  Lab 07/24/24 0857 07/24/24 1148 07/24/24 1356 07/25/24 0512  NA 139 138  --  138  K 4.4 3.8  --  4.7  CL 106 104  --  105  CO2 24 26  --  24  GLUCOSE 88 79  --  77  BUN 19 18  --  11  CREATININE 0.75 0.83  --  0.80  CALCIUM 8.4* 8.1*  --  8.3*  MG  --   --  1.8  --    GFR: Estimated Creatinine Clearance: 118.8 mL/min (by C-G formula based on SCr of 0.8 mg/dL). Liver Function Tests: Recent Labs  Lab 07/24/24 0857 07/24/24 1148  AST 16 17  ALT 10 12  ALKPHOS  --  50  BILITOT 0.3 <0.2  PROT 5.8* 5.3*  ALBUMIN  --  3.5   No results for input(s): LIPASE, AMYLASE in the last 168 hours. No results for input(s): AMMONIA in the last 168 hours. Coagulation Profile: Recent Labs  Lab 07/24/24 1148  INR 1.1   Cardiac Enzymes: No results for input(s): CKTOTAL, CKMB, CKMBINDEX, TROPONINI in the last 168 hours. BNP (last 3 results) No results for input(s): PROBNP in the last 8760 hours. HbA1C: Recent Labs    07/24/24 0857  HGBA1C 5.1   CBG: No results for input(s): GLUCAP in the last 168 hours. Lipid Profile: No results for input(s): CHOL, HDL, LDLCALC, TRIG, CHOLHDL, LDLDIRECT in the last 72 hours. Thyroid  Function Tests: Recent Labs    07/24/24 1356  TSH  0.861   Anemia Panel: Recent Labs    07/24/24 0857 07/24/24 1356  FERRITIN 6* 6*  TIBC 315 279  IRON 15* <10*   Urine analysis:    Component Value Date/Time   COLORURINE YELLOW (A) 12/25/2017 1646   APPEARANCEUR CLOUDY (A) 12/25/2017 1646   APPEARANCEUR Clear 12/20/2012 0048   LABSPEC 1.013 12/25/2017 1646   LABSPEC 1.031 12/20/2012 0048   PHURINE 7.0 12/25/2017 1646   GLUCOSEU NEGATIVE 12/25/2017 1646   GLUCOSEU Negative 12/20/2012 0048   HGBUR NEGATIVE 12/25/2017 1646   BILIRUBINUR NEGATIVE 12/25/2017 1646   BILIRUBINUR Negative 12/20/2012 0048   KETONESUR 5 (A) 12/25/2017 1646   PROTEINUR NEGATIVE 12/25/2017 1646   NITRITE NEGATIVE 12/25/2017 1646   LEUKOCYTESUR NEGATIVE 12/25/2017 1646   LEUKOCYTESUR Negative 12/20/2012 0048   Sepsis Labs: @LABRCNTIP (procalcitonin:4,lacticidven:4)  ) Recent Results (from the past 240 hours)  Gastrointestinal Panel by PCR , Stool     Status: None   Collection Time: 07/25/24  4:45 AM   Specimen: Stool  Result Value Ref Range Status   Campylobacter species NOT DETECTED NOT DETECTED Final   Plesimonas shigelloides NOT DETECTED NOT DETECTED Final   Salmonella species NOT DETECTED NOT DETECTED Final   Yersinia enterocolitica NOT DETECTED NOT DETECTED Final   Vibrio species NOT DETECTED NOT DETECTED Final   Vibrio cholerae NOT DETECTED NOT DETECTED Final   Enteroaggregative E coli (EAEC) NOT DETECTED NOT DETECTED Final   Enteropathogenic E coli (EPEC) NOT DETECTED NOT DETECTED Final   Enterotoxigenic E coli (ETEC) NOT DETECTED NOT DETECTED Final   Shiga like toxin producing E coli (STEC) NOT DETECTED NOT DETECTED Final   Shigella/Enteroinvasive E coli (EIEC) NOT DETECTED NOT DETECTED Final   Cryptosporidium NOT DETECTED NOT DETECTED Final   Cyclospora cayetanensis NOT DETECTED NOT DETECTED Final   Entamoeba histolytica NOT DETECTED NOT DETECTED Final   Giardia lamblia NOT DETECTED NOT DETECTED Final   Adenovirus F40/41 NOT DETECTED  NOT DETECTED Final   Astrovirus NOT DETECTED NOT DETECTED Final   Norovirus GI/GII NOT DETECTED NOT DETECTED Final   Rotavirus A NOT DETECTED NOT DETECTED Final   Sapovirus (I, II, IV, and V) NOT DETECTED NOT DETECTED Final    Comment: Performed at [facility], [address] [place], [place] [address]  C Difficile Quick Screen w PCR reflex     Status: None   Collection Time: 07/25/24  4:45 AM   Specimen: Stool  Result Value Ref Range Status   C Diff antigen NEGATIVE NEGATIVE Final   C Diff toxin NEGATIVE NEGATIVE Final   C Diff interpretation No C. difficile detected.  Final  Comment: Performed at [facility], [address], [place], [place] [address]         Radiology Studies: No results found.      Scheduled Meds:  sodium chloride  flush  3 mL Intravenous Q12H   Continuous Infusions:  sodium chloride  20 mL/hr at 07/24/24 2357     LOS: 0 days     Devaughn KATHEE Ban, MD [facility]   If 7PM-7AM, please contact night-coverage [email or web address] Password [email or web address] 07/25/2024, 9:24 AM

## 2024-07-25 NOTE — TOC CM/SW Note (Signed)
  Transition of Care [facility]) Screening Note   Patient Details  Name: Roy Burch Date of Birth: Feb 23, 2001   Transition of Care [facility]) CM/SW Contact:    Corean ONEIDA Haddock, RN Phone Number: 07/25/2024, 12:06 PM    Transition of Care Department [facility]) has reviewed patient and no TOC needs have been identified at this time.  If new patient transition needs arise, please place a TOC consult.

## 2024-07-25 NOTE — Progress Notes (Signed)
 Pt scheduled for colonoscopy later in am. Pt is not tolerating prep well. Pt has taken in 1/2 of NuLytely, no BM at this time, pt educated on importance of completing prep for procedure and verbalized an understanding. Will continue to encourage intake and monitor progress, call bell in reach

## 2024-07-25 NOTE — Op Note (Signed)
 [facility] Gastroenterology Patient Name: Roy Burch Procedure Date: 07/25/2024 1:12 PM MRN: [id number] Account #: [id number] Date of Birth: 2001/02/14 Admit Type: Inpatient Age: 23 Room: [facility] ENDO ROOM 4 Gender: Male Note Status: Finalized Instrument Name: Colon Scope [id number] Procedure:             Colonoscopy Indications:           Hematochezia Providers:             Rogelia Copping MD, MD Referring MD:          Parris LABOR. Zafirov (Referring MD) Medicines:             Propofol  per Anesthesia Complications:         No immediate complications. Procedure:             Pre-Anesthesia Assessment:                        - Prior to the procedure, a History and Physical was                         performed, and patient medications and allergies were                         reviewed. The patient's tolerance of previous                         anesthesia was also reviewed. The risks and benefits                         of the procedure and the sedation options and risks                         were discussed with the patient. All questions were                         answered, and informed consent was obtained. Prior                         Anticoagulants: The patient has taken no anticoagulant                         or antiplatelet agents. ASA Grade Assessment: II - A                         patient with mild systemic disease. After reviewing                         the risks and benefits, the patient was deemed in                         satisfactory condition to undergo the procedure.                        After obtaining informed consent, the colonoscope was                         passed under direct vision. Throughout the procedure,  the patient's blood pressure, pulse, and oxygen                         saturations were monitored continuously. The                         Colonoscope was introduced through the anus and                          advanced to the the cecum, identified by appendiceal                         orifice and ileocecal valve. The colonoscopy was                         performed without difficulty. The patient tolerated                         the procedure well. The quality of the bowel                         preparation was good. Findings:      The perianal and digital rectal examinations were normal.      A polypoid lesion was found in the distal sigmoid colon. The lesion was       sessile. No bleeding was present. This was biopsied with a cold forceps       for histology.      Inflammation was found in the terminal ileum. The inflammation was       severe. Biopsies were taken with a cold forceps for histology. Impression:            - Polypoid lesion in the distal sigmoid colon.                         Biopsied.                        - Crohn's disease with ileitis. Inflammation was                         found. This was severe. Biopsied. Recommendation:        - Return patient to hospital ward for ongoing care.                        - Resume previous diet.                        - Continue present medications.                        - Budesonide  9mg  qd. Procedure Code(s):     --- Professional ---                        [phone], Colonoscopy, flexible; with biopsy, single or                         multiple Diagnosis Code(s):     --- Professional ---  K92.1, Melena (includes Hematochezia)                        K50.00, Crohn's disease of small intestine without                         complications CPT copyright 2022 American Medical Association. All rights reserved. The codes documented in this report are preliminary and upon coder review may  be revised to meet current compliance requirements. Rogelia Copping MD, MD 07/25/2024 1:37:18 PM This report has been signed electronically. Number of Addenda: 0 Note Initiated On: 07/25/2024 1:12 PM Scope Withdrawal Time: 0 hours 10  minutes 3 seconds  Total Procedure Duration: 0 hours 11 minutes 48 seconds  Estimated Blood Loss:  Estimated blood loss: none.      [facility]

## 2024-07-25 NOTE — Transfer of Care (Signed)
 Immediate Anesthesia Transfer of Care Note  Patient: Roy Burch  Procedure(s) Performed: COLONOSCOPY POLYPECTOMY, INTESTINE  Patient Location: Endoscopy Unit  Anesthesia Type:General  Level of Consciousness: drowsy  Airway & Oxygen Therapy: Patient Spontanous Breathing  Post-op Assessment: Report given to RN and Post -op Vital signs reviewed and stable  Post vital signs: Reviewed and stable  Last Vitals:  Vitals Value Taken Time  BP 96/58 07/25/24 13:42  Temp    Pulse 70 07/25/24 13:42  Resp 21 07/25/24 13:42  SpO2 100 % 07/25/24 13:42  Vitals shown include unfiled device data.  Last Pain:  Vitals:   07/25/24 0745  TempSrc:   PainSc: 0-No pain         Complications: No notable events documented.

## 2024-07-25 NOTE — Anesthesia Preprocedure Evaluation (Signed)
 Anesthesia Evaluation  Patient identified by MRN, date of birth, ID band Patient awake    Reviewed: Allergy & Precautions, H&P , NPO status , Patient's Chart, lab work & pertinent test results, reviewed documented beta blocker date and time   History of Anesthesia Complications Negative for: history of anesthetic complications  Airway Mallampati: III  TM Distance: >3 FB Neck ROM: full    Dental  (+) Dental Advidsory Given, Caps   Pulmonary shortness of breath and with exertion, neg sleep apnea, neg COPD, neg recent URI   Pulmonary exam normal breath sounds clear to auscultation       Cardiovascular Exercise Tolerance: Good negative cardio ROS Normal cardiovascular exam Rhythm:regular Rate:Normal     Neuro/Psych  PSYCHIATRIC DISORDERS Anxiety     negative neurological ROS     GI/Hepatic negative GI ROS, Neg liver ROS,,,  Endo/Other  negative endocrine ROS    Renal/GU negative Renal ROS  negative genitourinary   Musculoskeletal   Abdominal   Peds  Hematology  (+) Blood dyscrasia, anemia   Anesthesia Other Findings Past Medical History: No date: ADHD 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 No date: Crohn's disease of small intestine with rectal bleeding (HCC) No date: Hydrocephalus (HCC) 01/07/2018: Mastoiditis of left side   Reproductive/Obstetrics negative OB ROS                              Anesthesia Physical Anesthesia Plan  ASA: 2  Anesthesia Plan: General   Post-op Pain Management:    Induction: Intravenous  PONV Risk Score and Plan: 2 and Propofol  infusion and TIVA  Airway Management Planned: Natural Airway and Nasal Cannula  Additional Equipment:   Intra-op Plan:   Post-operative Plan:   Informed Consent: I have reviewed the patients History and Physical, chart, labs and discussed the procedure including the risks, benefits and  alternatives for the proposed anesthesia with the patient or authorized representative who has indicated his/her understanding and acceptance.     Dental Advisory Given  Plan Discussed with: Anesthesiologist, CRNA and Surgeon  Anesthesia Plan Comments:         Anesthesia Quick Evaluation

## 2024-07-25 NOTE — Anesthesia Postprocedure Evaluation (Signed)
 Anesthesia Post Note  Patient: Roy Burch  Procedure(s) Performed: COLONOSCOPY POLYPECTOMY, INTESTINE  Patient location during evaluation: Endoscopy Anesthesia Type: General Level of consciousness: awake and alert Pain management: pain level controlled Vital Signs Assessment: post-procedure vital signs reviewed and stable Respiratory status: spontaneous breathing, nonlabored ventilation, respiratory function stable and patient connected to nasal cannula oxygen Cardiovascular status: blood pressure returned to baseline and stable Postop Assessment: no apparent nausea or vomiting Anesthetic complications: no   No notable events documented.   Last Vitals:  Vitals:   07/25/24 1349 07/25/24 1359  BP: (!) 87/49   Pulse:  87  Resp:    Temp:    SpO2:  99%    Last Pain:  Vitals:   07/25/24 1359  TempSrc:   PainSc: 0-No pain                 Prentice Murphy

## 2024-07-26 DIAGNOSIS — K922 Gastrointestinal hemorrhage, unspecified: Secondary | ICD-10-CM | POA: Insufficient documentation

## 2024-07-26 DIAGNOSIS — K50911 Crohn's disease, unspecified, with rectal bleeding: Secondary | ICD-10-CM

## 2024-07-26 LAB — TYPE AND SCREEN
ABO/RH(D): O POS
Antibody Screen: NEGATIVE
Unit division: 0
Unit division: 0

## 2024-07-26 LAB — CBC
HCT: 29.2 % — ABNORMAL LOW (ref 39.0–52.0)
Hemoglobin: 9.3 g/dL — ABNORMAL LOW (ref 13.0–17.0)
MCH: 26.8 pg (ref 26.0–34.0)
MCHC: 31.8 g/dL (ref 30.0–36.0)
MCV: 84.1 fL (ref 80.0–100.0)
Platelets: 294 K/uL (ref 150–400)
RBC: 3.47 MIL/uL — ABNORMAL LOW (ref 4.22–5.81)
RDW: 15.6 % — ABNORMAL HIGH (ref 11.5–15.5)
WBC: 5.7 K/uL (ref 4.0–10.5)
nRBC: 0 % (ref 0.0–0.2)

## 2024-07-26 LAB — BPAM RBC
Blood Product Expiration Date: 202512292359
Blood Product Expiration Date: 202512312359
ISSUE DATE / TIME: 202512041339
ISSUE DATE / TIME: 202512051641
Unit Type and Rh: 5100
Unit Type and Rh: 5100

## 2024-07-26 LAB — BASIC METABOLIC PANEL WITH GFR
Anion gap: 8 (ref 5–15)
BUN: 9 mg/dL (ref 6–20)
CO2: 26 mmol/L (ref 22–32)
Calcium: 8.8 mg/dL — ABNORMAL LOW (ref 8.9–10.3)
Chloride: 107 mmol/L (ref 98–111)
Creatinine, Ser: 0.76 mg/dL (ref 0.61–1.24)
GFR, Estimated: >60 mL/min (ref >60)
Glucose, Bld: 88 mg/dL (ref 70–99)
Potassium: 4.1 mmol/L (ref 3.5–5.1)
Sodium: 141 mmol/L (ref 135–145)

## 2024-07-26 LAB — HEPATITIS B SURFACE ANTIGEN: Hepatitis B Surface Ag: NONREACTIVE

## 2024-07-26 LAB — HEPATITIS A ANTIBODY, IGM: Hep A IgM: NONREACTIVE

## 2024-07-26 LAB — HEPATITIS A ANTIBODY, TOTAL: hep A Total Ab: NONREACTIVE

## 2024-07-26 MED ORDER — PREDNISONE 20 MG PO TABS: 40.0000 mg | ORAL_TABLET | Freq: Every day | ORAL | 0 refills | Status: AC

## 2024-07-26 MED ORDER — FERROUS SULFATE 325 (65 FE) MG PO TBEC: 325.0000 mg | DELAYED_RELEASE_TABLET | ORAL | 0 refills | Status: AC

## 2024-07-26 MED ORDER — METHYLPREDNISOLONE SODIUM SUCC 125 MG IJ SOLR
60.0000 mg | Freq: Every day | INTRAMUSCULAR | Status: DC
  Administered 2024-07-26: 60 mg via INTRAVENOUS
  Filled 2024-07-26: qty 2

## 2024-07-26 MED ORDER — OMEPRAZOLE MAGNESIUM 20 MG PO TBEC: 20.0000 mg | DELAYED_RELEASE_TABLET | Freq: Every day | ORAL | 0 refills | Status: AC

## 2024-07-26 NOTE — Discharge Summary (Signed)
 Roy Burch FMW:[id number] DOB: [birth year] DOA: 07/24/2024  PCP: Everlene Parris LABOR, MD  Admit date: 07/24/2024 Discharge date: 07/26/2024  Time spent: 35 minutes  Recommendations for Outpatient Follow-up:  Pcp f/u Gi f/u ~3 weeks     Discharge Diagnoses:  Principal Problem:   Exacerbation of Crohn's disease (HCC) Active Problems:   Symptomatic anemia   Lightheadedness   GI bleed   Discharge Condition: improving  Diet recommendation: regular  Filed Weights   07/24/24 1051 07/25/24 0500 07/26/24 0500  Weight: 55.6 kg 58.5 kg 55.8 kg    History of present illness:  From admission h and p Roy Burch is a 23 y.o. year old male with medical history of Crohn's disease previously on Humira and MDD presenting to the ED for rectal bleeding of subacute nature.     Pt states he has been having some abdominal discomfort and blood in stools since Sunday, 07/20/2024.  Patient states he has these episodes but they do not last this long.  He has history of Crohn's disease and was on Humira but is not on it anymore.  He is not able to give me a reason why he is currently not taking this medication.  He denies any fevers or chills.  He states he was in the process of establishing with a PCP and they referred him here given he became lightheaded when they are drawing his blood.     On arrival to the ED patient was noted to be HDS stable.  Lab work obtained.  CBC without leukocytosis but anemia at 6.8 with baseline around 10.  MCV is normal at 82.  CMP overall unremarkable but with mild decrease in calcium and protein levels.  Given his anemia and symptoms of lightheadedness, [facility] contacted for admission.  Hospital Course:   Patient with history crohn's disease, no f/u for the past 4 years, presenting with several days gastrointestinal bleeding. Transfused 2 units. Colonoscopy performed showing severe inflammation terminal ileum consistent with crohn's flare. Systemic steroids  started. Bleeding now much improved, hgb stable, stable for d/c per GI. Plan will be prednisone  40 daily with GI f/u in about 3 weeks. Patient strongly advised to follow up.  Procedures: Colonoscopy 12/5   Consultations: GI  Discharge Exam: Vitals:   07/26/24 0434 07/26/24 1002  BP: 112/65 113/65  Pulse: 81 91  Resp: 16 18  Temp: 98 F (36.7 C) 98.8 F (37.1 C)  SpO2: 99% 100%    General: NAD Cardiovascular: RRR Respiratory: CTAB Abdomen: soft, NT  Discharge Instructions   Discharge Instructions     Diet general   Complete by: As directed    Increase activity slowly   Complete by: As directed       Allergies as of 07/26/2024       Reactions   Poison Ivy Treatments Itching        Medication List     STOP taking these medications    adalimumab 40 MG/0.4ML pen Commonly known as: HUMIRA   Adalimumab 80 MG/0.8ML & 40MG /0.4ML Pskt   budesonide  3 MG 24 hr capsule Commonly known as: ENTOCORT EC    oxyCODONE-acetaminophen  5-325 MG tablet Commonly known as: PERCOCET/ROXICET       TAKE these medications    ferrous sulfate  325 (65 FE) MG EC tablet Take 1 tablet (325 mg total) by mouth every other day. What changed:  medication strength how much to take when to take this   omeprazole  20 MG tablet Commonly known as: PriLOSEC   OTC Take 1 tablet (20 mg total) by mouth daily.   predniSONE  20 MG tablet Commonly known as: DELTASONE  Take 2 tablets (40 mg total) by mouth daily with breakfast.       Allergies  Allergen Reactions   Poison Ivy Treatments Itching    Follow-up Information     Therisa Bi, MD Follow up.   Specialty: Gastroenterology Contact information: [address] [place] [address] [phone]         Everlene Parris LABOR, MD Follow up.   Specialty: Family Medicine Contact information: [address] [place] [address] [phone]                  The results of significant diagnostics from this  hospitalization (including imaging, microbiology, ancillary and laboratory) are listed below for reference.    Significant Diagnostic Studies: No results found.  Microbiology: Recent Results (from the past 240 hours)  Gastrointestinal Panel by PCR , Stool     Status: None   Collection Time: 07/25/24  4:45 AM   Specimen: Stool  Result Value Ref Range Status   Campylobacter species NOT DETECTED NOT DETECTED Final   Plesimonas shigelloides NOT DETECTED NOT DETECTED Final   Salmonella species NOT DETECTED NOT DETECTED Final   Yersinia enterocolitica NOT DETECTED NOT DETECTED Final   Vibrio species NOT DETECTED NOT DETECTED Final   Vibrio cholerae NOT DETECTED NOT DETECTED Final   Enteroaggregative E coli (EAEC) NOT DETECTED NOT DETECTED Final   Enteropathogenic E coli (EPEC) NOT DETECTED NOT DETECTED Final   Enterotoxigenic E coli (ETEC) NOT DETECTED NOT DETECTED Final   Shiga like toxin producing E coli (STEC) NOT DETECTED NOT DETECTED Final   Shigella/Enteroinvasive E coli (EIEC) NOT DETECTED NOT DETECTED Final   Cryptosporidium NOT DETECTED NOT DETECTED Final   Cyclospora cayetanensis NOT DETECTED NOT DETECTED Final   Entamoeba histolytica NOT DETECTED NOT DETECTED Final   Giardia lamblia NOT DETECTED NOT DETECTED Final   Adenovirus F40/41 NOT DETECTED NOT DETECTED Final   Astrovirus NOT DETECTED NOT DETECTED Final   Norovirus GI/GII NOT DETECTED NOT DETECTED Final   Rotavirus A NOT DETECTED NOT DETECTED Final   Sapovirus (I, II, IV, and V) NOT DETECTED NOT DETECTED Final    Comment: Performed at [facility], [address] [place], [place] [address]  C Difficile Quick Screen w PCR reflex     Status: None   Collection Time: 07/25/24  4:45 AM   Specimen: Stool  Result Value Ref Range Status   C Diff antigen NEGATIVE NEGATIVE Final   C Diff toxin NEGATIVE NEGATIVE Final   C Diff interpretation No C. difficile detected.  Final    Comment: Performed at [facility], [address] [place], [place] [address]     Labs: Basic Metabolic Panel: Recent Labs  Lab 07/24/24 0857 07/24/24 1148 07/24/24 1356 07/25/24 0512 07/26/24 0833  NA 139 138  --  138 141  K 4.4 3.8  --  4.7 4.1  CL 106 104  --  105 107  CO2 24 26  --  24 26  GLUCOSE 88 79  --  77 88  BUN 19 18  --  11 9  CREATININE 0.75 0.83  --  0.80 0.76  CALCIUM 8.4* 8.1*  --  8.3* 8.8*  MG  --   --  1.8  --   --    Liver Function Tests: Recent Labs  Lab 07/24/24 0857 07/24/24 1148  AST 16 17  ALT 10 12  ALKPHOS  --  50  BILITOT 0.3 <0.2  PROT 5.8* 5.3*  ALBUMIN  --  3.5   No results for input(s): LIPASE, AMYLASE in the last 168 hours. No results for input(s): AMMONIA in the last 168 hours. CBC: Recent Labs  Lab 07/24/24 0857 07/24/24 1148 07/24/24 1148 07/24/24 1731 07/25/24 0512 07/25/24 1428 07/25/24 2232 07/26/24 0833  WBC CANCELED 9.9  --   --  4.2 3.6*  --  5.7  NEUTROABS  --  8.6*  --   --   --   --   --   --   HGB  --  6.8*   < > 8.4* 7.6* 6.2* 8.1* 9.3*  HCT  --  22.3*   < > 26.3* 24.6* 19.4* 24.3* 29.2*  MCV  --  82.6  --   --  84.2 80.8  --  84.1  PLT  --  242  --   --  165 181  --  294   < > = values in this interval not displayed.   Cardiac Enzymes: No results for input(s): CKTOTAL, CKMB, CKMBINDEX, TROPONINI in the last 168 hours. BNP: BNP (last 3 results) No results for input(s): BNP in the last 8760 hours.  ProBNP (last 3 results) No results for input(s): PROBNP in the last 8760 hours.  CBG: No results for input(s): GLUCAP in the last 168 hours.     Signed:  Devaughn KATHEE Ban MD.  [facility] 07/26/2024, 10:27 AM

## 2024-07-26 NOTE — Progress Notes (Addendum)
 Inpatient Follow-up/Progress Note   Patient ID: Roy Burch is a 23 y.o. male.  Overnight Events / Subjective Findings NAEON. Patient feeling better. Less bowel movement frequency and blood. Abdominal pain becoming less. Tolerating PO. Hoping to go home soon. No other acute GI complaints. Denies n/v, EIM.  Review of Systems  Constitutional:  Negative for activity change, appetite change, chills, diaphoresis, fatigue, fever and unexpected weight change.  HENT:  Negative for trouble swallowing and voice change.   Respiratory:  Negative for shortness of breath and wheezing.   Cardiovascular:  Negative for chest pain, palpitations and leg swelling.  Gastrointestinal:  Positive for abdominal pain, blood in stool and diarrhea. Negative for abdominal distention, anal bleeding, constipation, nausea and vomiting.  Musculoskeletal:  Negative for arthralgias and myalgias.  Skin:  Negative for color change and pallor.  Neurological:  Negative for dizziness, syncope and weakness.  Psychiatric/Behavioral:  Negative for confusion. The patient is not nervous/anxious.   All other systems reviewed and are negative.    Medications  Current Facility-Administered Medications:    acetaminophen  (TYLENOL ) tablet 650 mg, 650 mg, Oral, Q6H PRN **OR** acetaminophen  (TYLENOL ) suppository 650 mg, 650 mg, Rectal, Q6H PRN, Jinny, Darren, MD   methylPREDNISolone  sodium succinate (SOLU-MEDROL ) 125 mg/2 mL injection 60 mg, 60 mg, Intravenous, Daily, Onita Elspeth Sharper, DO   sodium chloride  flush (NS) 0.9 % injection 3 mL, 3 mL, Intravenous, Q12H, Wohl, Darren, MD, 3 mL at 07/25/24 2028   acetaminophen  **OR** acetaminophen    Objective    Vitals:   07/25/24 1716 07/25/24 2001 07/26/24 0434 07/26/24 0500  BP: (!) 98/45 (!) 101/56 112/65   Pulse: 99  81   Resp: 16 18 16    Temp: 97.8 F (36.6 C) 98.1 F (36.7 C) 98 F (36.7 C)   TempSrc: Oral Oral Oral   SpO2: 100% 100% 99%   Weight:    55.8 kg   Height:         Physical Exam Vitals and nursing note reviewed.  Constitutional:      General: He is not in acute distress.    Appearance: He is not toxic-appearing or diaphoretic.     Comments: Thin appearing  HENT:     Head: Normocephalic and atraumatic.     Nose: Nose normal.     Mouth/Throat:     Mouth: Mucous membranes are moist.     Pharynx: Oropharynx is clear.  Eyes:     General: No scleral icterus.    Extraocular Movements: Extraocular movements intact.  Cardiovascular:     Rate and Rhythm: Normal rate and regular rhythm.     Heart sounds: Normal heart sounds. No murmur heard.    No friction rub. No gallop.  Pulmonary:     Effort: Pulmonary effort is normal. No respiratory distress.     Breath sounds: Normal breath sounds. No wheezing, rhonchi or rales.  Abdominal:     General: Bowel sounds are normal. There is no distension.     Palpations: Abdomen is soft.     Tenderness: There is no abdominal tenderness. There is no guarding or rebound.  Musculoskeletal:     Cervical back: Neck supple.     Right lower leg: No edema.     Left lower leg: No edema.  Skin:    General: Skin is warm and dry.     Coloration: Skin is not jaundiced or pale.  Neurological:     General: No focal deficit present.     Mental  Status: He is alert and oriented to person, place, and time. Mental status is at baseline.  Psychiatric:        Mood and Affect: Mood normal.        Behavior: Behavior normal.        Thought Content: Thought content normal.        Judgment: Judgment normal.      Laboratory Data Recent Labs  Lab 07/24/24 1148 07/24/24 1731 07/25/24 0512 07/25/24 1428 07/25/24 2232  WBC 9.9  --  4.2 3.6*  --   HGB 6.8*   < > 7.6* 6.2* 8.1*  HCT 22.3*   < > 24.6* 19.4* 24.3*  PLT 242  --  165 181  --   NEUTOPHILPCT 87  --   --   --   --   LYMPHOPCT 5  --   --   --   --   MONOPCT 8  --   --   --   --   EOSPCT 0  --   --   --   --    < > = values in this interval not  displayed.   Recent Labs  Lab 07/24/24 0857 07/24/24 1148 07/25/24 0512  NA 139 138 138  K 4.4 3.8 4.7  CL 106 104 105  CO2 24 26 24   BUN 19 18 11   CREATININE 0.75 0.83 0.80  CALCIUM 8.4* 8.1* 8.3*  PROT 5.8* 5.3*  --   BILITOT 0.3 <0.2  --   ALKPHOS  --  50  --   ALT 10 12  --   AST 16 17  --   GLUCOSE 88 79 77   Recent Labs  Lab 07/24/24 1148  INR 1.1      Imaging Studies: No results found.  Assessment:   # Crohns Disease exacerbation - 07/25/24 Colonoscopy demonstrating severe disease at the TI and disease/polypoid area in the sigmoid colon; bx pending  # Symptomatic anemia 2/2 above - required 1 u prbc on 12/5 with improvement to 8.1 from 6.2  # hematochezia 2/2 above  Plan:  Given severity appreciated on colonoscopy- change to solumedrol 60mg  iv. If patient hospitalized - can do up to 3 days Check CRP Check viral hepatitis panel and quantiferon gold for anticipated biologic need Patient should be discharged on 40 mg of prednisone  daily (at least 2-3 week supply) Will have patient come to office and discuss taper at that point along with further medication needs Pt tolerating PO, pain reduced, and decreased frequency and blood associated with BM  Given patient improvement and tolerating PO today or tomorrow would be reasonable for dispo D/w patient's hospitalist physician  Our office will call to arrange appt with Dr. Therisa as previously discussed.  Management of other medical comorbidities as per primary team  I personally performed the service.  Thank you for allowing us  to participate in this patient's care.  Please do not hesitate to call regarding questions or concerns.  Elspeth Ozell Jungling, DO [facility]  Portions of the record may have been created with voice recognition software. Occasional wrong-word or 'sound-a-like' substitutions may have occurred due to the inherent limitations of voice recognition software.  Read the  chart carefully and recognize, using context, where substitutions may have occurred.

## 2024-07-28 LAB — SURGICAL PATHOLOGY

## 2024-07-29 ENCOUNTER — Ambulatory Visit: Payer: Self-pay | Admitting: Gastroenterology

## 2024-07-29 LAB — HCV AB W REFLEX TO QUANT PCR: HCV Ab: NONREACTIVE

## 2024-07-29 LAB — HEPATITIS B SURFACE ANTIBODY, QUANTITATIVE: Hep B S AB Quant (Post): <3.5 m[IU]/mL — ABNORMAL LOW

## 2024-07-29 LAB — HCV INTERPRETATION

## 2024-08-01 ENCOUNTER — Ambulatory Visit

## 2024-08-05 NOTE — Telephone Encounter (Signed)
 Pt scheduled with Dr. Therisa today, 08/05/24 to discuss results.

## 2024-08-05 NOTE — Progress Notes (Signed)
 Ruel Kung MD, MRCP(U.K) [address]  [place], [place] [address]  Main: [phone] Fax: [phone]   Gastroenterology Consultation  Referring Provider:     Kung Ruel, MD Primary Care Physician:  Dow Rolland SAILOR, MD Primary Gastroenterologist:  Dr. Ruel Kung  Reason for Consultation:    Establish care for Crohn's disease        HPI:  Roy Burch is a 23 y.o. y/o male  History of Present Illness Roy Burch is a 23 year old male with Crohn's disease who presents for follow-up after recent hospitalization.  Since discharge, he feels 'okay for the most part' and has no current symptoms of diarrhea or abdominal pain. He reports nausea or vomiting.  He has been on a steroid regimen for about a week, taking two tablets daily, though he is unsure of the dosage. He also takes omeprazole  daily and ferrous sulfate  every other day.  In the past, he was on Humira but discontinued it due to logistical issues with refrigeration while [place] of [place]. He has not resumed it since then. He reports eating and drinking well.   He Presented to the emergency room on 07/24/2024.  He has a history of Meckel's diverticulum requiring surgical ex lap at [facility] back in 2020 at the same time he underwent EGD and colonoscopy that showed inflammation consistent with Crohn's disease was treated with budesonide , complicated by perianal abscess treated with Humira in 2020 but only took it for a few months and was unsure if it helped him or not.  An MRI of the abdomen in 2020 showed terminal ileitis involving the distal 15 cm of the ileum and a small intersphincteric perianal abscess at 1 o'clock position.  Treated with antibiotic for the perianal abscess seen by Dr. Aundria back in 2021 did not follow-up subsequently.  On admission on 07/24/2024 he had a hemoglobin of 6.8 g normal LFTs creatinine of 0.83 albumin of 3.5.  07/24/2024 colonoscopy polypoid lesion in the distal sigmoid colon  perianal and digital exam are normal inflammation seen in the terminal ileum inflammation is severe biopsies were taken with cold forceps for histology.  He was commenced on prednisone  40 mg a day on 07/24/2024.  Biopsies of the terminal ileum showed features suggestive of healed ulcer with reactive epithelial changes 07/24/2024: Ferritin 6, CRP 4.7 INR 1.1 albumin 3.5 TSH normal HIV negative hepatitis A total antibody nonreactive hepatitis B surface antibody negative hepatitis B surface antigen nonreactiveStool test were negative for infection.  Hepatitis C antibody negative  Past Medical History:  Diagnosis Date   Anemia 12/2018   Posthemorrhagic after GI bleed leading to diagnosis of Crohn's disease, nadir hemoglobin 8.1 requiring transfusion   Cerebellar intracranial abscess    16   Crohn's disease (CMS-HCC) 12/2018   Stricturing, nonpenetrating ileal Crohn's disease   Mastoiditis     Past Surgical History:  Procedure Laterality Date   CRANIOTOMY EXPLORATORY POSTERIOR FOSSA Left 12/26/2017   Procedure: CRANIECTOMY OR CRANIOTOMY, EXPLORATORY; INFRATENTORIAL (POSTERIOR FOSSA);  Surgeon: Sebastian Camellia Sharper, MD;  Location: [facility] OR;  Service: Neurosurgery;  Laterality: Left;   EXAMINATION UNDER ANESTHESIA EAR/NOSE/THROAT Left 12/26/2017   Procedure: OTOLARYNGOLOGIC EXAMINATION UNDER GENERAL ANESTHESIA;  Surgeon: Ronell Purchase, MD;  Location: [facility] OR;  Service: Otolaryngology Head and Neck;  Laterality: Left;   MASTOIDECTOMY Left 12/31/2017   Procedure: TRANSMASTOID ANTROTOMY (SIMPLE MASTOIDECTOMY);  Surgeon: Ronell Purchase, MD;  Location: [facility] OR;  Service: Otolaryngology Head and Neck;  Laterality: Left;  Please have facial nerve monitoring and stryker drill available.    TYMPANOPLASTY W/MASTOIDECTOMY Left 04/15/2018   Procedure: TYMPANOPLASTY WITH MASTOIDECTOMY (INCLUDING CANALPLASTY, MIDDLE EAR SURGERY, TYMPANIC MEMBRANE REPAIR); WITHOUT OSSICULAR CHAIN RECONSTRUCTION;   Surgeon: Ronell Purchase, MD;  Location: [facility] OR;  Service: Otolaryngology Head and Neck;  Laterality: Left;   GRAFT EAR CARTILAGE TO EAR AUTOGENOUS Left 04/15/2018   Procedure: GRAFT; EAR CARTILAGE, AUTOGENOUS, TO EAR (INCLUDES OBTAININGGRAFT);  Surgeon: Ronell Purchase, MD;  Location: [facility] OR;  Service: Otolaryngology Head and Neck;  Laterality: Left;   MICROSURGERY Left 04/15/2018   Procedure: MICROSURGICAL TECHNIQUES, REQUIRING USE OF OPERATING MICROSCOPE (LIST IN ADDITION TO PRIMARY PROCEDURE);  Surgeon: Ronell Purchase, MD;  Location: [facility] OR;  Service: Otolaryngology Head and Neck;  Laterality: Left;   LAPAROSCOPY DIAGNOSTIC N/A 12/26/2018   Procedure: LAPAROSCOPY, SURGICAL; ABDOMEN, PERITONEUM, AND OMENTUM, DIAGNOSTIC, WITH OR WITHOUT COLLECTION OF SPECIMEN(S) BY BRUSHING OR WASHING (SEPARATE PROCEDURE);  Surgeon: Jeannetta Victory Rend, MD;  Location: [facility] OR;  Service: Pediatric Surgery;  Laterality: N/A;   ESOPHAGOGASTRODOUDENOSCOPY W/BIOPSY N/A 12/27/2018   Procedure: ESOPHAGOGASTRODUODENOSCOPY, FLEXIBLE, TRANSORAL; WITH BIOPSY, SINGLE OR MULTIPLE;  Surgeon: Kandace Schirmer, MD;  [facility]: [facility] PROC;  Service: Gastroenterology;  Laterality: N/A;   COLONOSCOPY W/BIOPSY N/A 12/27/2018   Procedure: COLONOSCOPY, FLEXIBLE; WITH BIOPSY, SINGLE OR MULTIPLE;  Surgeon: Kandace Schirmer, MD;  Location: [facility] PROC;  Service: Gastroenterology;  Laterality: N/A;   TYMPANOPLASTY W/MASTIODECTOMY Left 01/27/2019   Procedure: TYMPANOPLASTY WITH MASTOIDECTOMY (INCLUDING CANALPLASTY, MIDDLE EAR SURGERY, TYMPANIC MEMBRANE REPAIR); WITH INTACT OR RECONSTRUCTED WALL, WITHOUT OSSICULAR CHAIN RECONSTRUCTION;  Surgeon: Ronell Purchase, MD;  Location: [facility] OR;  Service: Otolaryngology Head and Neck;  Laterality: Left;   MICROSURGERY Left 01/27/2019   Procedure: MICROSURGICAL TECHNIQUES, REQUIRING USE OF OPERATING MICROSCOPE (LIST IN ADDITION TO PRIMARY  PROCEDURE);  Surgeon: Ronell Purchase, MD;  Location: [facility] OR;  Service: Otolaryngology Head and Neck;  Laterality: Left;    Prior to Admission medications  Medication Sig Taking? Last Dose  ferrous sulfate  324 mg (65 mg iron) EC tablet Take 1 tablet (324 mg total) by mouth once daily Yes Taking  omeprazole  (PRILOSEC  OTC) 20 MG EC tablet Take 20 mg by mouth once daily Yes Taking  oxyCODONE-acetaminophen  (PERCOCET) 5-325 mg tablet Take 1 tablet by mouth every 6 (six) hours as needed for Pain for up to 10 doses Yes Taking  predniSONE  (DELTASONE ) 20 MG tablet Take 40 mg by mouth Yes Taking  adalimumab (HUMIRA,CF, PEN) 40 mg/0.4 mL pen injector kit Inject 40 mg subcutaneously every 14 (fourteen) days Patient not taking: Reported on 08/05/2024  Not Taking  adalimumab (HUMIRA,CF, PEN) 80 mg/0.8 mL PnKt Inject 2 pens (160 mg) subcutaneously on day 1, then inject 1 pen (80mg ) on day 15 Patient not taking: Reported on 08/05/2024  Not Taking  budesonide  (ENTOCORT EC ) 3 mg EC capsule TAKE 3 CAPSULES BY MOUTH EVERY MORNING Patient not taking: Reported on 08/05/2024  Not Taking  predniSONE  (DELTASONE ) 5 MG tablet Take 7 tablets (35 mg total) by mouth as directed for 7 days, THEN 6 tablets (30 mg total) as directed for 7 days, THEN 5 tablets (25 mg total) as directed for 7 days, THEN 4 tablets (20 mg total) as directed for 5 days, THEN 3 tablets (15 mg total) as directed for 5 days, THEN 2 tablets (10 mg total) as directed for 5 days, THEN 1 tablet (5 mg total) as directed for 5 days,  THEN 1 tablet (5 mg total) every other day for 8 days.      Family History  Problem Relation Name Age of Onset   High blood pressure (Hypertension) Father     No Known Problems Mother     No Known Problems Half-Brother     No Known Problems Half-Brother     No Known Problems Half-Sister     No Known Problems Half-Sister       Social History   Tobacco Use   Smoking status: Never   Smokeless tobacco: Never   Vaping Use   Vaping status: Former  Substance Use Topics   Alcohol use: Never   Drug use: Never    Allergies as of 08/05/2024 - Reviewed 08/05/2024  Allergen Reaction Noted   Other Itching 07/24/2024   Vit e-nonoxynol 9-aloe vera Itching 08/05/2024   Nsaids (non-steroidal anti-inflammatory drug) Other (See Comments) 08/05/2024    Review of Systems:    All systems reviewed and negative except where noted in HPI.   Physical Exam: BP 124/69   Pulse 92   Ht 175.3 cm (5' 9)   Wt 59.7 kg (131 lb 9.6 oz)   BMI 19.43 kg/m  No LMP for male patient. Psych:  Alert and cooperative. Normal mood and affect. General:   Alert,  Well-developed, well-nourished, pleasant and cooperative in NAD Head:  Normocephalic and atraumatic. Eyes:  Sclera clear, no icterus.   Conjunctiva pink. Ears:  Normal auditory acuity. Neck:  Supple; no masses or thyromegaly. Abdomen:  Normal bowel sounds.  No bruits.  Soft, non-tender and non-distended without masses, hepatosplenomegaly or hernias noted.  No guarding or rebound tenderness.    Neurologic:  Alert and oriented x3;  grossly normal neurologically. Psych:  Alert and cooperative. Normal mood and affect.  Imaging Studies: No results found.  Assessment and Plan:  Roy Burch is a 23 y.o. y/o male has been referred for Crohn's disease.  History of small bowel Crohn's disease, known stricturing on festinating affect the distal part of the terminal ileum for over 5 years not followed up with primary gastroenterologist at [facility] at some point in the past was treated with Humira was stopped did not follow-up recently presented to ER and was found to have active Crohn's disease commenced on steroids found to have severe iron deficiency anemia. 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 No diarrhea or abdominal pain. Emphasized need for deep remission to prevent complications.  Explained risks of long-term steroids and benefits of biologics, including potential side effects. - Determine insurance approval for biologic therapy (Tremfya, Humira, Stelara, Skyrizi). - Order lab tests prior to new medication.  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  Commencing on prednisone  2525 mg a day - Schedule follow-up in 2-3 weeks for medication approval and next steps. - MR enterography to obtain baseline study to determine extent of inflammation, fecal calprotectin  Iron deficiency anemia Low blood count. Oral iron insufficient for timely recovery. Recommended IV iron for faster improvement. - Refer to cancer center for IV iron infusion. - Discontinue oral iron after IV administration.   We discussed in detail the risks, benefits, and alternatives of biologic therapy for inflammatory bowel disease. Benefits include improved disease control, mucosal healing, steroid sparing, and reduced risk of hospitalization and surgery. Risks discussed include serious and opportunistic infections, infusion/injection reactions, malignancy risk (including lymphoma and non-melanoma skin cancer), immunogenicity with loss of response, and  need for ongoing monitoring. Alternatives including immunomodulators, small-molecule therapy, surgery, and no treatment were reviewed. The patient demonstrated understanding and agrees to proceed.  A total of 65 minutes was spent on this visit reviewing previous notes, counseling the patient on talking about the diagnosis of Crohn's disease reviewing its management options for biological therapy, risks associated with biological therapy tapering down steroids ordering and discussing further imaging adjusting meds, and documenting the findings in the note.  Prior to the office visit discussed his care with Dr. Onita and Dr. Jinny who took care of the increase in capitalization   Follow up in 2 to 3 weeks after approval of his biological  agent  Dr Kiran Anna MD,MRCP(U.K)

## 2024-08-06 ENCOUNTER — Telehealth

## 2024-08-06 DIAGNOSIS — F411 Generalized anxiety disorder: Secondary | ICD-10-CM | POA: Diagnosis not present

## 2024-08-06 DIAGNOSIS — Z8719 Personal history of other diseases of the digestive system: Secondary | ICD-10-CM | POA: Diagnosis not present

## 2024-08-06 DIAGNOSIS — D649 Anemia, unspecified: Secondary | ICD-10-CM

## 2024-08-06 DIAGNOSIS — E538 Deficiency of other specified B group vitamins: Secondary | ICD-10-CM | POA: Diagnosis not present

## 2024-08-06 MED ORDER — B-12 1000 MCG SL SUBL: 1000.0000 [IU] | SUBLINGUAL_TABLET | Freq: Every day | SUBLINGUAL | 1 refills | Status: AC

## 2024-08-06 MED ORDER — ESCITALOPRAM OXALATE 10 MG PO TABS: 10.0000 mg | ORAL_TABLET | Freq: Every day | ORAL | 0 refills | Status: AC

## 2024-08-06 NOTE — Progress Notes (Signed)
 Progress Note  Physician: Analucia Hush A Shawnese Magner, MD   Patient contacted 08/06/2024 at  2:00 PM EST by a video enabled telemedicine application and verified that I am speaking with the correct person.   Patient is aware of limitations of evaluation by telemedicine The patient expressed understanding and agreed to proceed.   HPI: Roy Burch is a 23 y.o. male presenting on 08/06/2024 for No chief complaint on file. . Discussed the use of AI scribe software for clinical note transcription with the patient, who gave verbal consent to proceed.  History of Present Illness   Roy Burch is a 23 year old male with Crohn's disease who presents for follow-up after a recent GI bleed.  Gastrointestinal bleeding and crohn's disease flare - Recent gastrointestinal bleed resulting in hospitalization and blood transfusion - Colonoscopy during hospitalization revealed severe inflammation in the terminal ileum - Discharged from hospital on December 6th, seen by GI yesterday  - No abnormal stools reported.  Mild lightheaded since hospitalization - Currently not on Humira; insurance approval pending for Humira, Stelara, or Skyrizi - Currently taking prednisone  40 mg daily - Taking omeprazole  daily - Taking an iron supplement - Referred for intravenous iron therapy  Hematologic and nutritional status - Low B12 levels on recent labs   Psychological symptoms - Experiences anxiety, described as 'constantly overthinking every little thing'  Concerned that he is autistic though no significant overt symptoms apparent   Does have FH of autism.   Social history:  Relevant past medical, surgical, family and social history reviewed and updated as indicated. Interim medical history since our last visit reviewed.  Allergies and medications reviewed and updated.  DATA REVIEWED: CHART IN EPIC  ROS: Negative unless specifically indicated above in HPI.   Current Medications[1]       Objective:  Telephone visit     Assessment & Plan:  History of Meckel's diverticulum  B12 deficiency  Symptomatic anemia  Generalized anxiety disorder   Assessment and Plan    Crohn's disease with recent gastrointestinal bleeding and terminal ileum involvement Awaiting insurance approval for Humira, Stelara, or Skyrizi. No current blood in stool. - Continue prednisone  40 mg daily for three weeks. Omeprazole  - Follow up with gastroenterology in 2-3 weeks for medication approval and initiation.  Iron deficiency anemia secondary to gastrointestinal blood loss Anemia due to gastrointestinal blood loss. Hemoglobin was 6.8 during hospital admission. Currently on iron supplements. Referred for IV iron by gastroenterology. - Continue iron supplements. - Follow up with gastroenterology for IV iron administration. - Ordered repeat CBC towards the end of the month to monitor hemoglobin levels.  Vitamin B12 deficiency Likely due to malabsorption from Crohn's disease. B12 is low, folate normal. - Prescribed sublingual B12 supplement, 1000 units daily. - Educated on the importance of B12 for cognitive function and neurologic health.  Generalized anxiety disorder Reports constant overthinking and anxiety. Discussed Lexapro  and cognitive behavioral therapy as management options. - Prescribed Lexapro  10 mg daily. - Educated on cognitive behavioral therapy as a long-term strategy for anxiety management. - Scheduled follow-up in April to assess response to treatment. F/u if any concerns with medication     No follow-ups on file.    [1]  Current Outpatient Medications:    ferrous sulfate  325 (65 FE) MG EC tablet, Take 1 tablet (325 mg total) by mouth every other day., Disp: 30 tablet, Rfl: 0   omeprazole  (PRILOSEC  OTC) 20 MG tablet, Take  1 tablet (20 mg total) by mouth daily., Disp: 30 tablet, Rfl: 0   predniSONE  (DELTASONE ) 20 MG tablet, Take 2 tablets (40 mg total) by mouth daily with  breakfast., Disp: 60 tablet, Rfl: 0

## 2024-09-12 ENCOUNTER — Other Ambulatory Visit: Payer: Self-pay | Admitting: Gastroenterology

## 2024-09-12 DIAGNOSIS — K50011 Crohn's disease of small intestine with rectal bleeding: Secondary | ICD-10-CM

## 2024-09-15 ENCOUNTER — Telehealth: Payer: Self-pay | Admitting: Oncology

## 2024-09-15 NOTE — Telephone Encounter (Signed)
 Due to weather, pt appts have been updated from 1/27 to 1/29. I left vm with new appt details and sent mychart msg to pt

## 2024-09-16 ENCOUNTER — Other Ambulatory Visit: Payer: Self-pay | Admitting: Obstetrics and Gynecology

## 2024-09-16 ENCOUNTER — Inpatient Hospital Stay

## 2024-09-16 ENCOUNTER — Inpatient Hospital Stay: Admitting: Oncology

## 2024-09-18 ENCOUNTER — Inpatient Hospital Stay

## 2024-09-18 ENCOUNTER — Inpatient Hospital Stay: Admitting: Oncology

## 2024-09-25 ENCOUNTER — Ambulatory Visit
Admission: RE | Admit: 2024-09-25 | Discharge: 2024-09-25 | Disposition: A | Source: Ambulatory Visit | Attending: Gastroenterology

## 2024-09-25 DIAGNOSIS — K50011 Crohn's disease of small intestine with rectal bleeding: Secondary | ICD-10-CM

## 2024-09-25 MED ORDER — GADOBUTROL 1 MMOL/ML IV SOLN
6.0000 mL | Freq: Once | INTRAVENOUS | Status: AC | PRN
  Administered 2024-09-25: 6 mL via INTRAVENOUS

## 2024-09-26 ENCOUNTER — Inpatient Hospital Stay

## 2024-09-26 ENCOUNTER — Encounter: Payer: Self-pay | Admitting: Oncology

## 2024-09-26 ENCOUNTER — Ambulatory Visit: Payer: Self-pay | Admitting: Gastroenterology

## 2024-09-26 ENCOUNTER — Inpatient Hospital Stay: Admitting: Oncology

## 2024-09-26 ENCOUNTER — Ambulatory Visit: Payer: Self-pay | Admitting: Oncology

## 2024-09-26 VITALS — BP 120/62 | HR 87 | Temp 97.1°F | Resp 18 | Wt 133.0 lb

## 2024-09-26 DIAGNOSIS — D649 Anemia, unspecified: Secondary | ICD-10-CM

## 2024-09-26 DIAGNOSIS — E538 Deficiency of other specified B group vitamins: Secondary | ICD-10-CM

## 2024-09-26 DIAGNOSIS — D500 Iron deficiency anemia secondary to blood loss (chronic): Secondary | ICD-10-CM

## 2024-09-26 DIAGNOSIS — K5000 Crohn's disease of small intestine without complications: Secondary | ICD-10-CM

## 2024-09-26 DIAGNOSIS — D509 Iron deficiency anemia, unspecified: Secondary | ICD-10-CM | POA: Insufficient documentation

## 2024-09-26 LAB — CBC WITH DIFFERENTIAL (CANCER CENTER ONLY)
Abs Immature Granulocytes: 0.05 10*3/uL (ref 0.00–0.07)
Basophils Absolute: 0 10*3/uL (ref 0.0–0.1)
Basophils Relative: 0 %
Eosinophils Absolute: 0 10*3/uL (ref 0.0–0.5)
Eosinophils Relative: 1 %
HCT: 27.9 % — ABNORMAL LOW (ref 39.0–52.0)
Hemoglobin: 7.5 g/dL — ABNORMAL LOW (ref 13.0–17.0)
Immature Granulocytes: 1 %
Lymphocytes Relative: 5 %
Lymphs Abs: 0.5 10*3/uL — ABNORMAL LOW (ref 0.7–4.0)
MCH: 18.7 pg — ABNORMAL LOW (ref 26.0–34.0)
MCHC: 26.9 g/dL — ABNORMAL LOW (ref 30.0–36.0)
MCV: 69.6 fL — ABNORMAL LOW (ref 80.0–100.0)
Monocytes Absolute: 0.4 10*3/uL (ref 0.1–1.0)
Monocytes Relative: 4 %
Neutro Abs: 7.9 10*3/uL — ABNORMAL HIGH (ref 1.7–7.7)
Neutrophils Relative %: 89 %
Platelet Count: 312 10*3/uL (ref 150–400)
RBC: 4.01 MIL/uL — ABNORMAL LOW (ref 4.22–5.81)
RDW: 18.6 % — ABNORMAL HIGH (ref 11.5–15.5)
WBC Count: 8.9 10*3/uL (ref 4.0–10.5)
nRBC: 0 % (ref 0.0–0.2)

## 2024-09-26 LAB — IRON AND TIBC
Iron: 16 ug/dL — ABNORMAL LOW (ref 45–182)
Saturation Ratios: 4 % — ABNORMAL LOW (ref 17.9–39.5)
TIBC: 435 ug/dL (ref 250–450)
UIBC: 419 ug/dL

## 2024-09-26 LAB — FERRITIN: Ferritin: 8 ng/mL — ABNORMAL LOW (ref 24–336)

## 2024-09-26 MED ORDER — CYANOCOBALAMIN 1000 MCG/ML IJ SOLN
1000.0000 ug | Freq: Once | INTRAMUSCULAR | Status: AC
  Administered 2024-09-26: 1000 ug via INTRAMUSCULAR
  Filled 2024-09-26: qty 1

## 2024-09-26 NOTE — Assessment & Plan Note (Signed)
 Follow up with GI

## 2024-09-26 NOTE — Progress Notes (Signed)
 " Hematology/Oncology Consult note [phone] [phone]        REFERRING PROVIDER: Everlene Parris LABOR, MD   CHIEF COMPLAINTS/REASON FOR VISIT:  Evaluation of iron deficiency anemia, B12 deficiency   ASSESSMENT & PLAN:   Iron deficiency anemia Labs are reviewed and discussed with patient. Lab Results  Component Value Date   HGB 7.5 (L) 09/26/2024   TIBC 435 09/26/2024   IRONPCTSAT 4 (L) 09/26/2024   FERRITIN 8 (L) 09/26/2024    Iron deficiency anemia due to chronic GI blood loss likely secondary to Crohn's disease.   I discussed about IV Venofer treatments. I discussed about the potential risks including but not limited to allergic reactions/infusion reactions including anaphylactic reactions, diarrhea, phlebitis, high blood pressure, wheezing, SOB, skin rash, weight gain,dark urine, leg swelling, back pain, headache, nausea and fatigue, etc. Patient agrees with the plan.  Recommend  IV venofer 300mg  x 5   Crohn's disease involving terminal ileum (HCC) Follow up with GI.   B12 deficiency Vitamin B12 deficiency with suboptimal adherence to oral supplementation.  He prefers sublingual B12 over IM B12 injections, due to busy work schedule He agrees with one dose of IM B12 1000 mcg injection, recommend sublingual 1000 mcg supplementation  as maintenance  - Administered B12 injection during the visit. - repeat B12 level in 3 months.    Orders Placed This Encounter  Procedures   CBC with Differential (Cancer Center Only)    Standing Status:   Future    Expected Date:   12/24/2024    Expiration Date:   03/24/2025   Iron and TIBC    Standing Status:   Future    Expected Date:   12/24/2024    Expiration Date:   03/24/2025   Ferritin    Standing Status:   Future    Expected Date:   12/24/2024    Expiration Date:   03/24/2025   Vitamin B12    Standing Status:   Future    Expected Date:   12/24/2024    Expiration Date:   03/24/2025   CBC with Differential (Cancer  Center Only)    Standing Status:   Future    Number of Occurrences:   1    Expected Date:   09/26/2024    Expiration Date:   12/25/2024   Iron and TIBC    Standing Status:   Future    Number of Occurrences:   1    Expected Date:   09/26/2024    Expiration Date:   12/25/2024   Ferritin    Standing Status:   Future    Number of Occurrences:   1    Expected Date:   09/26/2024    Expiration Date:   12/25/2024   Follow up in 3 months.  All questions were answered. The patient knows to call the clinic with any problems, questions or concerns.  Zelphia Cap, MD, PhD [facility] Hematology Oncology 09/26/2024   HISTORY OF PRESENTING ILLNESS:   Roy Burch is a  24 y.o.  male with PMH listed below was seen in consultation at the request of  Zafirov, Clarissa A, MD  for evaluation of iron deficiency anemia, B12 deficiency  Discussed the use of AI scribe software for clinical note transcription with the patient, who gave verbal consent to proceed.    He was referred after laboratory testing in December 2025 revealed iron deficiency anemia and vitamin B12 deficiency. He is not currently taking oral iron supplementation and  has not received prior intravenous iron therapy. He endorses mild fatigue but denies shortness of breath, palpitations, chest pain, lightheadedness, or abdominal pain. He does not smoke cigarettes.  For vitamin B12 deficiency, he has been taking B12 gummies inconsistently for approximately one month due to difficulty with adherence. He has not used B12 injections or sublingual formulations previously.  Patient has Crohn's disease and follows up with Dr. Therisa. Currently on tapering prednisone . 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   Crohn's disease of  small intestine with rectal bleeding (HCC)    Hydrocephalus (HCC)    Mastoiditis of left side 01/07/2018    SURGICAL HISTORY: Past Surgical History:  Procedure Laterality Date   BRAIN SURGERY     COLONOSCOPY N/A 07/25/2024   Procedure: COLONOSCOPY;  Surgeon: Jinny Carmine, MD;  Location: [facility];  Service: Endoscopy;  Laterality: N/A;   MASTOIDECTOMY     POLYPECTOMY  07/25/2024   Procedure: POLYPECTOMY, INTESTINE;  Surgeon: Jinny Carmine, MD;  Location: [facility] ENDOSCOPY;  Service: Endoscopy;;   TYMPANOPLASTY W/MASTIODECTOMY     TYMPANOPLASTY W/MASTOIDECTOMY      SOCIAL HISTORY: Social History   Socioeconomic History   Marital status: Single    Spouse name: Not on file   Number of children: Not on file   Years of education: Not on file   Highest education level: GED or equivalent  Occupational History   Not on file  Tobacco Use   Smoking status: Never    Passive exposure: Yes   Smokeless tobacco: Not on file  Substance and Sexual Activity   Alcohol use: No   Drug use: Not on file   Sexual activity: Not on file  Other Topics Concern   Not on file  Social History Narrative   Not on file   Social Drivers of Health   Tobacco Use: Medium Risk (09/26/2024)   Patient History    Smoking Tobacco Use: Never    Smokeless Tobacco Use: Unknown    Passive Exposure: Yes  Financial Resource Strain: Medium Risk (08/06/2024)   Overall Financial Resource Strain (CARDIA)    Difficulty of Paying Living Expenses: Somewhat hard  Food Insecurity: No Food Insecurity (09/26/2024)   Epic    Worried About Radiation Protection Practitioner of Food in the Last Year: Never true    Ran Out of Food in the Last Year: Never true  Transportation Needs: No Transportation Needs (09/26/2024)   Epic    Lack of Transportation (Medical): No    Lack of Transportation (Non-Medical): No  Physical Activity: Sufficiently Active (08/06/2024)   Exercise Vital Sign    Days of Exercise per Week: 7 days    Minutes of Exercise per  Session: 150+ min  Stress: Stress Concern Present (09/26/2024)   [employer] of Occupational Health - Occupational Stress Questionnaire    Feeling of Stress: To some extent  Social Connections: Unknown (08/06/2024)   Social Connection and Isolation Panel    Frequency of Communication with Friends and Family: More than three times a week    Frequency of Social Gatherings with Friends and Family: More than three times a week    Attends Religious Services: Never    Database Administrator or Organizations: No    Attends Banker Meetings: Not on file  Marital Status: Patient declined  Intimate Partner Violence: Not At Risk (09/26/2024)   Epic    Fear of Current or Ex-Partner: No    Emotionally Abused: No    Physically Abused: No    Sexually Abused: No  Depression (PHQ2-9): High Risk (07/24/2024)   Depression (PHQ2-9)    PHQ-2 Score: 21  Alcohol Screen: Low Risk (08/06/2024)   Alcohol Screen    Last Alcohol Screening Score (AUDIT): 1  Housing: Low Risk (09/26/2024)   Epic    Unable to Pay for Housing in the Last Year: No    Number of Times Moved in the Last Year: 0    Homeless in the Last Year: No  Utilities: Not At Risk (09/26/2024)   Epic    Threatened with loss of utilities: No  Health Literacy: Adequate Health Literacy (09/26/2024)   B1300 Health Literacy    Frequency of need for help with medical instructions: Never    FAMILY HISTORY: Family History  Problem Relation Age of Onset   Mental illness Father    Thyroid  disease Father    Bipolar disorder Father    Heart disease Father    Cancer Father    Depression Father     ALLERGIES:  is allergic to poison ivy treatments.  MEDICATIONS:  Current Outpatient Medications  Medication Sig Dispense Refill   Cyanocobalamin  (B-12) 1000 MCG SUBL Place 1,000 Units under the tongue daily. 90 tablet 1   escitalopram  (LEXAPRO ) 10 MG tablet Take 1 tablet (10 mg total) by mouth daily. 1/2 tab daily for 1 week and then  increase to 1 tab daily 90 tablet 0   ferrous sulfate  325 (65 FE) MG EC tablet Take 1 tablet (325 mg total) by mouth every other day. 30 tablet 0   omeprazole  (PRILOSEC  OTC) 20 MG tablet Take 1 tablet (20 mg total) by mouth daily. 30 tablet 0   Vitamin D, Ergocalciferol, (DRISDOL) 1.25 MG (50000 UNIT) CAPS capsule Take 50,000 Units by mouth.     No current facility-administered medications for this visit.    Review of Systems - Oncology PHYSICAL EXAMINATION:  Vitals:   09/26/24 1113  BP: 120/62  Pulse: 87  Resp: 18  Temp: (!) 97.1 F (36.2 C)  SpO2: 100%   Filed Weights   09/26/24 1113  Weight: 133 lb (60.3 kg)    Physical Exam  LABORATORY DATA:  I have reviewed the data as listed    Latest Ref Rng & Units 09/26/2024   12:08 PM 07/26/2024    8:33 AM 07/25/2024   10:32 PM  CBC  WBC 4.0 - 10.5 K/uL 8.9  5.7    Hemoglobin 13.0 - 17.0 g/dL 7.5  9.3  8.1   Hematocrit 39.0 - 52.0 % 27.9  29.2  24.3   Platelets 150 - 400 K/uL 312  294        Latest Ref Rng & Units 07/26/2024    8:33 AM 07/25/2024    5:12 AM 07/24/2024   11:48 AM  CMP  Glucose 70 - 99 mg/dL 88  77  79   BUN 6 - 20 mg/dL 9  11  18    Creatinine 0.61 - 1.24 mg/dL 9.23  9.19  9.16   Sodium 135 - 145 mmol/L 141  138  138   Potassium 3.5 - 5.1 mmol/L 4.1  4.7  3.8   Chloride 98 - 111 mmol/L 107  105  104   CO2 22 - 32 mmol/L 26  24  26   Calcium 8.9 - 10.3 mg/dL 8.8  8.3  8.1   Total Protein 6.5 - 8.1 g/dL   5.3   Total Bilirubin 0.0 - 1.2 mg/dL   <9.7   Alkaline Phos 38 - 126 U/L   50   AST 15 - 41 U/L   17   ALT 0 - 44 U/L   12       RADIOGRAPHIC STUDIES: I have personally reviewed the radiological images as listed and agreed with the findings in the report. MR ENTERO ABDOMEN W WO CONTRAST Result Date: 09/25/2024 CLINICAL DATA:  History of Crohn's disease with rectal bleeding EXAM: MR ABDOMEN AND PELVIS WITHOUT AND WITH CONTRAST (MR ENTEROGRAPHY) TECHNIQUE: Multiplanar, multisequence MRI of the abdomen  and pelvis was performed both before and during bolus administration of intravenous contrast. Negative oral contrast VoLumen was given. CONTRAST:  6mL GADAVIST  GADOBUTROL  1 MMOL/ML IV SOLN COMPARISON:  None Available. FINDINGS: COMBINED FINDINGS FOR BOTH MR ABDOMEN AND PELVIS Lower chest: No acute findings. Hepatobiliary: No mass or other parenchymal abnormality identified. No bile duct dilation. Gallbladder is decompressed. Layering T1 hyperintensity may represent gallstones or sludge. Pancreas: No mass, inflammatory changes, or other parenchymal abnormality identified. Spleen:  Within normal limits in size and appearance. Adrenals/Urinary Tract: No adrenal nodules. No suspicious renal masses identified. No evidence of hydronephrosis. 3.6 cm exophytic left lower pole renal cyst containing trace layering hemorrhage/protein (4:9). No specific follow-up imaging recommended. Stomach/Bowel: Circumferential mural thickening of the terminal ileum, spanning approximately 10 cm. No abnormal upstream bowel dilation to suggest stricturing. Mild mural thickening of the appendix with apparent fistulous connection of the distal appendiceal tip to the proximal portion of the terminal ileal inflammation (7: 45-50). These areas of mural thickening are associated with enhancement and diffusion restriction. The right lateral wall of the upper to mid rectum is inseparable from the distal appendix. No definite finding of fistulization. Segmental circumferential mural thickening of left upper quadrant small bowel (7:27, 29), which resolves on postcontrast image. No substantial abnormal enhancement or diffusion restriction, likely underdistention. Vascular/Lymphatic: No pathologically enlarged lymph nodes identified. No abdominal aortic aneurysm demonstrated. Reproductive: Prostate is unremarkable. Other:  Trace pelvic free fluid. Musculoskeletal: No suspicious bone lesions identified. IMPRESSION: 1. Findings of active inflammation  involving the terminal ileum, spanning approximately 10 cm, complicated by suspected fistulization to the appendix. No abnormal upstream bowel dilation to suggest stricturing. 2. The right lateral wall of the upper to mid rectum is inseparable from the distal appendix, which may be tethered to the area of inflammation. No definite finding of rectal fistulization. Recommend correlation with recently performed colonography. Electronically Signed   By: Limin  Xu M.D.   On: 09/25/2024 11:37   MR ENTERO ABDOMEN W WO CONTRAST Result Date: 09/25/2024 CLINICAL DATA:  History of Crohn's disease with rectal bleeding EXAM: MR ABDOMEN AND PELVIS WITHOUT AND WITH CONTRAST (MR ENTEROGRAPHY) TECHNIQUE: Multiplanar, multisequence MRI of the abdomen and pelvis was performed both before and during bolus administration of intravenous contrast. Negative oral contrast VoLumen was given. CONTRAST:  6mL GADAVIST  GADOBUTROL  1 MMOL/ML IV SOLN COMPARISON:  None Available. FINDINGS: COMBINED FINDINGS FOR BOTH MR ABDOMEN AND PELVIS Lower chest: No acute findings. Hepatobiliary: No mass or other parenchymal abnormality identified. No bile duct dilation. Gallbladder is decompressed. Layering T1 hyperintensity may represent gallstones or sludge. Pancreas: No mass, inflammatory changes, or other parenchymal abnormality identified. Spleen:  Within normal limits in size and appearance. Adrenals/Urinary Tract: No adrenal nodules. No suspicious renal masses  identified. No evidence of hydronephrosis. 3.6 cm exophytic left lower pole renal cyst containing trace layering hemorrhage/protein (4:9). No specific follow-up imaging recommended. Stomach/Bowel: Circumferential mural thickening of the terminal ileum, spanning approximately 10 cm. No abnormal upstream bowel dilation to suggest stricturing. Mild mural thickening of the appendix with apparent fistulous connection of the distal appendiceal tip to the proximal portion of the terminal ileal  inflammation (7: 45-50). These areas of mural thickening are associated with enhancement and diffusion restriction. The right lateral wall of the upper to mid rectum is inseparable from the distal appendix. No definite finding of fistulization. Segmental circumferential mural thickening of left upper quadrant small bowel (7:27, 29), which resolves on postcontrast image. No substantial abnormal enhancement or diffusion restriction, likely underdistention. Vascular/Lymphatic: No pathologically enlarged lymph nodes identified. No abdominal aortic aneurysm demonstrated. Reproductive: Prostate is unremarkable. Other:  Trace pelvic free fluid. Musculoskeletal: No suspicious bone lesions identified. IMPRESSION: 1. Findings of active inflammation involving the terminal ileum, spanning approximately 10 cm, complicated by suspected fistulization to the appendix. No abnormal upstream bowel dilation to suggest stricturing. 2. The right lateral wall of the upper to mid rectum is inseparable from the distal appendix, which may be tethered to the area of inflammation. No definite finding of rectal fistulization. Recommend correlation with recently performed colonography. Electronically Signed   By: Limin  Xu M.D.   On: 09/25/2024 11:37         "

## 2024-09-26 NOTE — Assessment & Plan Note (Addendum)
 Labs are reviewed and discussed with patient. Lab Results  Component Value Date   HGB 7.5 (L) 09/26/2024   TIBC 435 09/26/2024   IRONPCTSAT 4 (L) 09/26/2024   FERRITIN 8 (L) 09/26/2024    Iron deficiency anemia due to chronic GI blood loss likely secondary to Crohn's disease.   I discussed about IV Venofer treatments. I discussed about the potential risks including but not limited to allergic reactions/infusion reactions including anaphylactic reactions, diarrhea, phlebitis, high blood pressure, wheezing, SOB, skin rash, weight gain,dark urine, leg swelling, back pain, headache, nausea and fatigue, etc. Patient agrees with the plan.  Recommend  IV venofer 300mg  x 5

## 2024-09-26 NOTE — Assessment & Plan Note (Signed)
 Vitamin B12 deficiency with suboptimal adherence to oral supplementation.  He prefers sublingual B12 over IM B12 injections, due to busy work schedule He agrees with one dose of IM B12 1000 mcg injection, recommend sublingual 1000 mcg supplementation  as maintenance  - Administered B12 injection during the visit. - repeat B12 level in 3 months.

## 2024-12-19 ENCOUNTER — Inpatient Hospital Stay

## 2024-12-24 ENCOUNTER — Inpatient Hospital Stay: Admitting: Oncology

## 2024-12-24 ENCOUNTER — Inpatient Hospital Stay
